# Patient Record
Sex: Female | Born: 1940 | Race: Black or African American | Hispanic: No | Smoking: Never smoker
Health system: Southern US, Community
[De-identification: ages and names within clinical notes are randomized; demographics above are authoritative.]

## PROBLEM LIST (undated history)

## (undated) DIAGNOSIS — I10 Essential (primary) hypertension: Secondary | ICD-10-CM

## (undated) DIAGNOSIS — E119 Type 2 diabetes mellitus without complications: Secondary | ICD-10-CM

## (undated) DIAGNOSIS — Z5189 Encounter for other specified aftercare: Secondary | ICD-10-CM

## (undated) HISTORY — PX: BREAST SURGERY: SHX581

## (undated) HISTORY — PX: BACK SURGERY: SHX140

## (undated) HISTORY — PX: WRIST SURGERY: SHX841

## (undated) HISTORY — PX: BREAST EXCISIONAL BIOPSY: SUR124

## (undated) HISTORY — PX: NEPHRECTOMY: SHX65

## (undated) HISTORY — PX: ABDOMINAL HYSTERECTOMY: SHX81

---

## 1964-07-29 HISTORY — PX: ABDOMINAL HYSTERECTOMY: SHX81

## 1974-07-29 HISTORY — PX: BREAST SURGERY: SHX581

## 1976-07-29 HISTORY — PX: BACK SURGERY: SHX140

## 1980-07-29 HISTORY — PX: NEPHRECTOMY: SHX65

## 2005-10-04 ENCOUNTER — Encounter (INDEPENDENT_AMBULATORY_CARE_PROVIDER_SITE_OTHER): Payer: Self-pay | Admitting: *Deleted

## 2005-10-04 ENCOUNTER — Ambulatory Visit (HOSPITAL_COMMUNITY): Admission: RE | Admit: 2005-10-04 | Discharge: 2005-10-04 | Payer: Self-pay | Admitting: Gastroenterology

## 2007-03-12 ENCOUNTER — Encounter: Admission: RE | Admit: 2007-03-12 | Discharge: 2007-03-12 | Payer: Self-pay | Admitting: Internal Medicine

## 2007-07-30 HISTORY — PX: WRIST SURGERY: SHX841

## 2007-10-22 ENCOUNTER — Inpatient Hospital Stay (HOSPITAL_COMMUNITY): Admission: EM | Admit: 2007-10-22 | Discharge: 2007-10-25 | Payer: Self-pay | Admitting: Emergency Medicine

## 2007-10-23 ENCOUNTER — Encounter (INDEPENDENT_AMBULATORY_CARE_PROVIDER_SITE_OTHER): Payer: Self-pay | Admitting: Gastroenterology

## 2008-02-19 ENCOUNTER — Emergency Department (HOSPITAL_COMMUNITY): Admission: EM | Admit: 2008-02-19 | Discharge: 2008-02-19 | Payer: Self-pay | Admitting: Emergency Medicine

## 2009-01-18 ENCOUNTER — Ambulatory Visit (HOSPITAL_BASED_OUTPATIENT_CLINIC_OR_DEPARTMENT_OTHER): Admission: RE | Admit: 2009-01-18 | Discharge: 2009-01-18 | Payer: Self-pay | Admitting: *Deleted

## 2009-01-18 ENCOUNTER — Encounter (INDEPENDENT_AMBULATORY_CARE_PROVIDER_SITE_OTHER): Payer: Self-pay | Admitting: *Deleted

## 2009-02-10 ENCOUNTER — Encounter: Admission: RE | Admit: 2009-02-10 | Discharge: 2009-02-10 | Payer: Self-pay | Admitting: Internal Medicine

## 2009-04-07 ENCOUNTER — Ambulatory Visit (HOSPITAL_COMMUNITY): Admission: RE | Admit: 2009-04-07 | Discharge: 2009-04-07 | Payer: Self-pay | Admitting: Nephrology

## 2009-05-05 ENCOUNTER — Encounter: Admission: RE | Admit: 2009-05-05 | Discharge: 2009-05-05 | Payer: Self-pay | Admitting: Internal Medicine

## 2009-07-04 ENCOUNTER — Encounter: Admission: RE | Admit: 2009-07-04 | Discharge: 2009-07-04 | Payer: Self-pay | Admitting: Surgery

## 2009-07-11 ENCOUNTER — Encounter (HOSPITAL_COMMUNITY): Admission: RE | Admit: 2009-07-11 | Discharge: 2009-07-28 | Payer: Self-pay | Admitting: Nephrology

## 2009-07-28 IMAGING — CT CT PELVIS W/ CM
2 of 5 series · 17 of 46 positions shown, 19 images · IV contrast (READICAT/WATER & [ID] OMNI 300)
Comparison: none

CLINICAL DATA: Abdominal pain, particularly right lower quadrant.
ABDOMEN CT WITH CONTRAST:
TECHNIQUE: Multidetector CT imaging of the abdomen was performed following the standard protocol during bolus administration of intravenous contrast.
Contrast:  100 cc of Omnipaque 300.
There is a   6 mm noncalcified nodule within the right lower lobe on image #4.  CT chest may be helpful to assess further.  The liver is somewhat low in attenuation suggesting fatty infiltration.  No focal abnormality is seen.  There are calcified gallstones layering within the gallbladder. The pancreas is normal in size and the pancreatic duct is minimally prominent.  The adrenal glands and spleen appear normal. The left kidney is prominent, probably hypertrophied with absent right kidney.  Has the patient had prior right nephrectomy?  Abdominal aorta is normal in caliber.  No adenopathy is seen.  A few small nodes are present within the right lower quadrant.
TECHNIQUE: Multidetector CT imaging of the pelvis was performed following the standard protocol during bolus administration of intravenous contrast.
The appendix fills well with air and contrast and there is no evidence of acute appendicitis.  The urinary bladder is not well distended but is grossly unremarkable.  The patient has previously undergone hysterectomy and no adnexal lesion is seen.  No fluid is noted within the pelvis.  The terminal ileum appears grossly normal.  The colon is decompressed.  No inflammatory process is seen.

[Series 3: routine abdomen · axial · 0.70mm/px · z∈[-364,+21]mm · 14 of 86 slices shown, 16 images]
[im 5/86  soft-tissue]
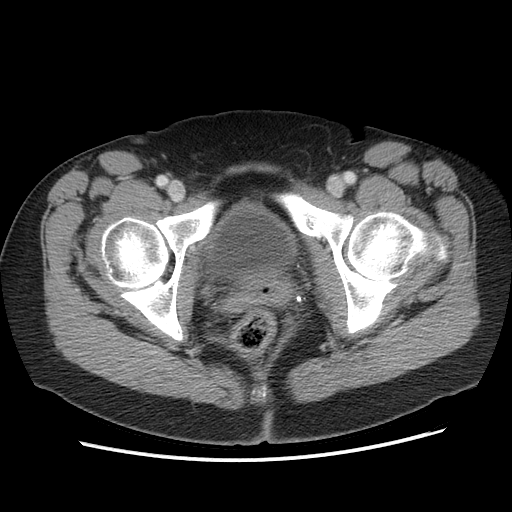
[im 5/86  bone]
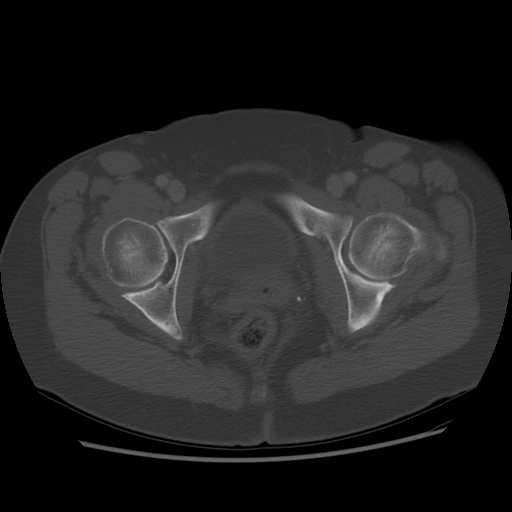
[im 10/86  soft-tissue]
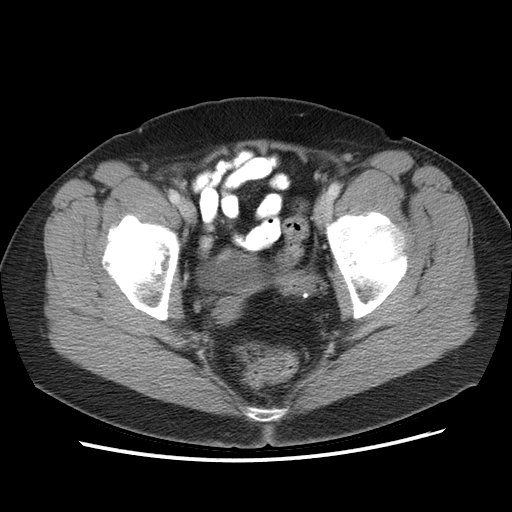
[im 19/86  soft-tissue]
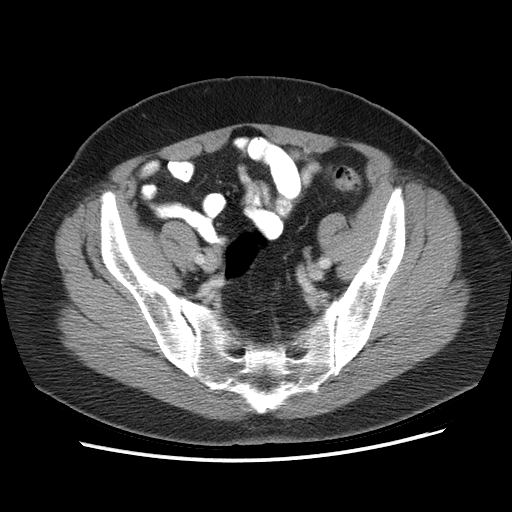
[im 24/86  soft-tissue]
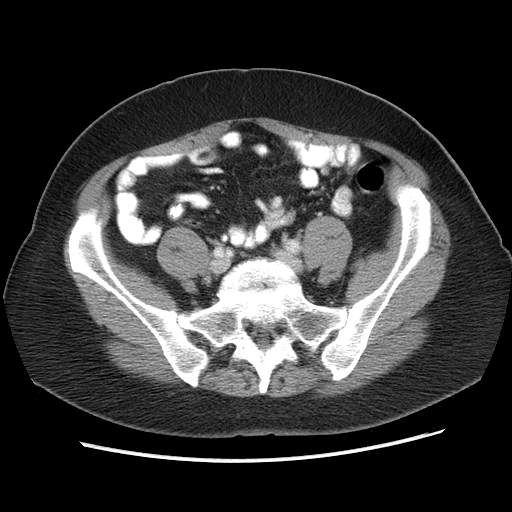
[im 29/86  soft-tissue]
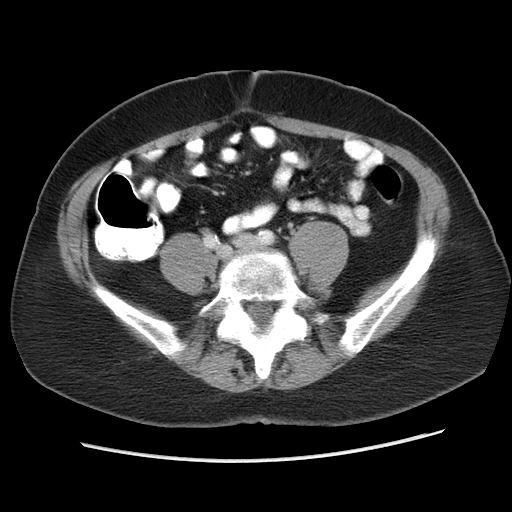
[im 34/86  soft-tissue]
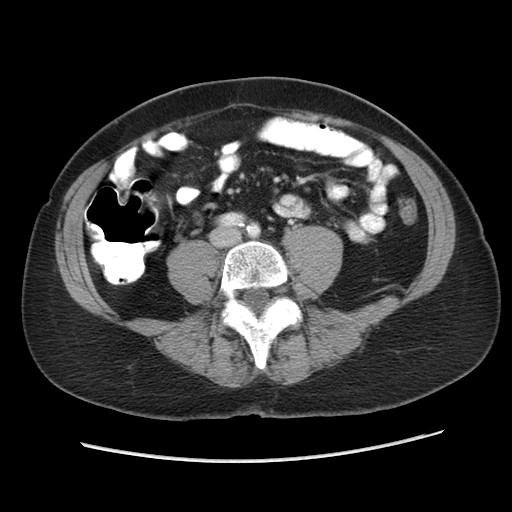
[im 38/86  soft-tissue]
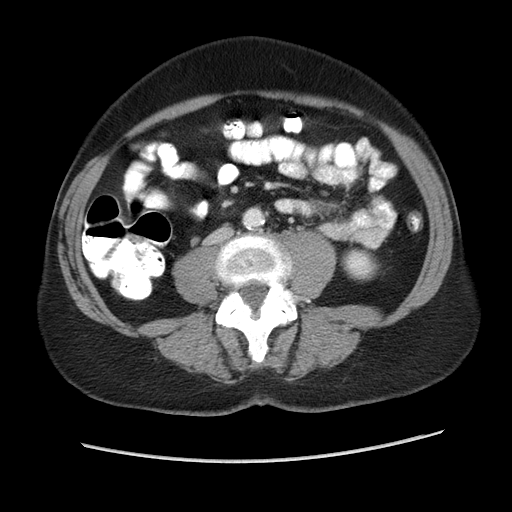
[im 48/86  soft-tissue]
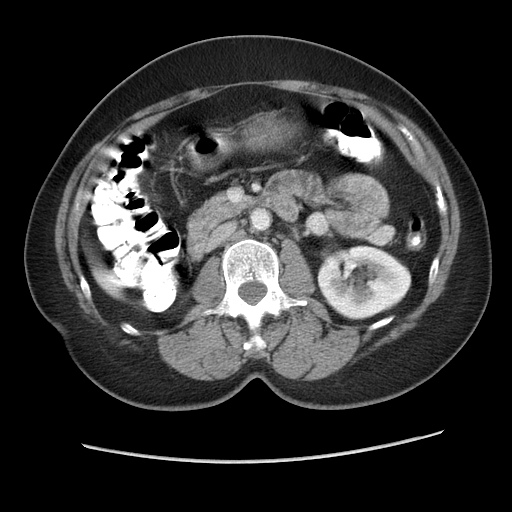
[im 52/86  soft-tissue]
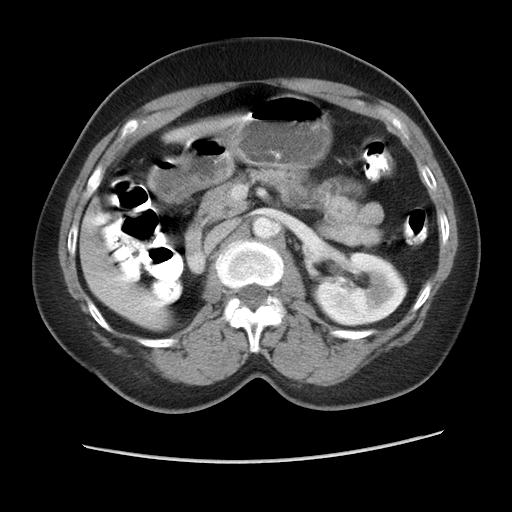
[im 52/86  bone]
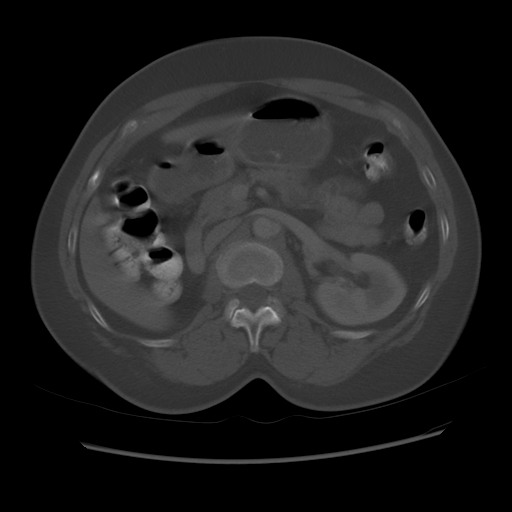
[im 57/86  soft-tissue]
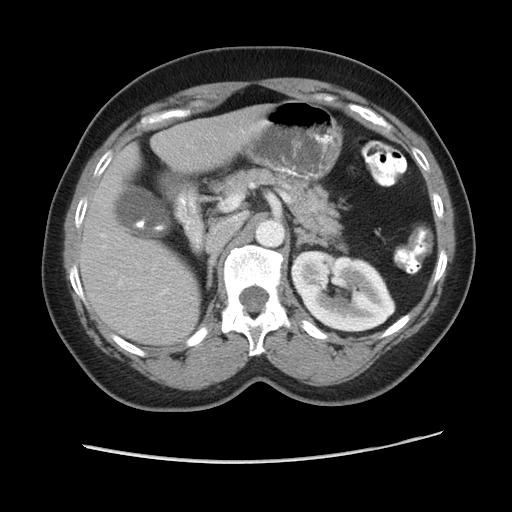
[im 62/86  soft-tissue]
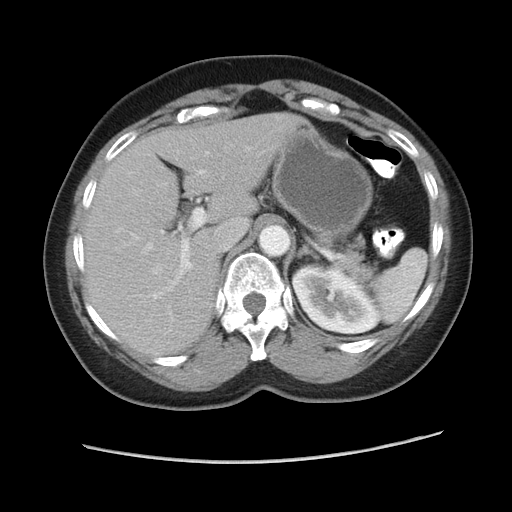
[im 67/86  soft-tissue]
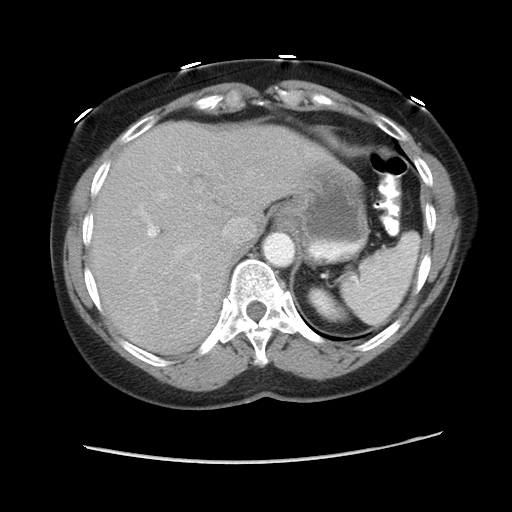
[im 76/86  soft-tissue]
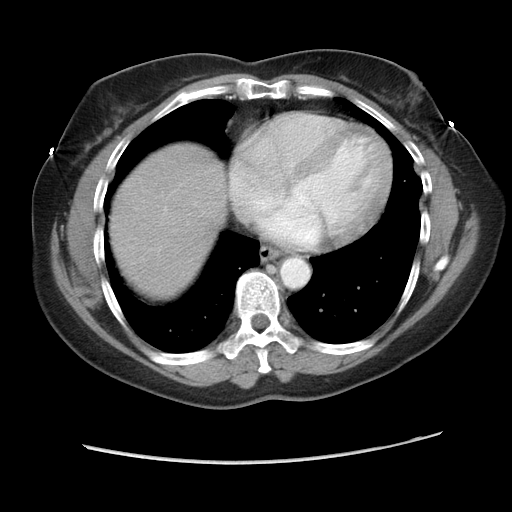
[im 81/86  soft-tissue]
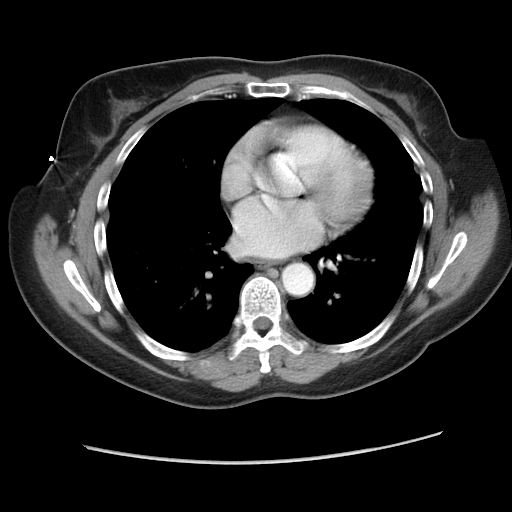

[Series 602: sagittal body · sagittal · 0.99mm/px · 3 of 145 slices shown]
[im 49/145  soft-tissue]
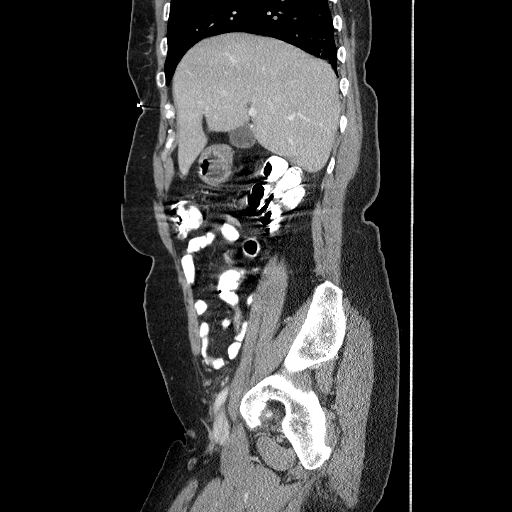
[im 65/145  soft-tissue]
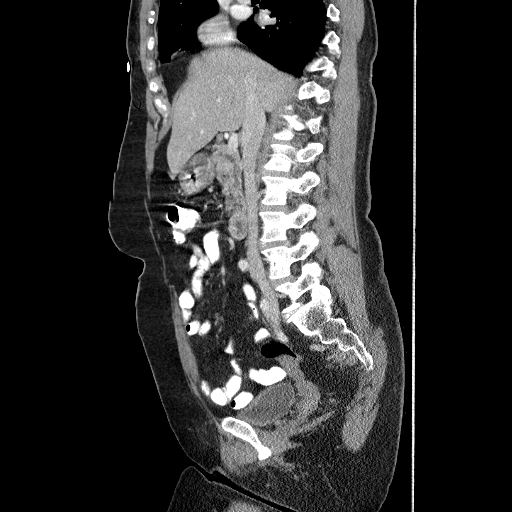
[im 81/145  soft-tissue]
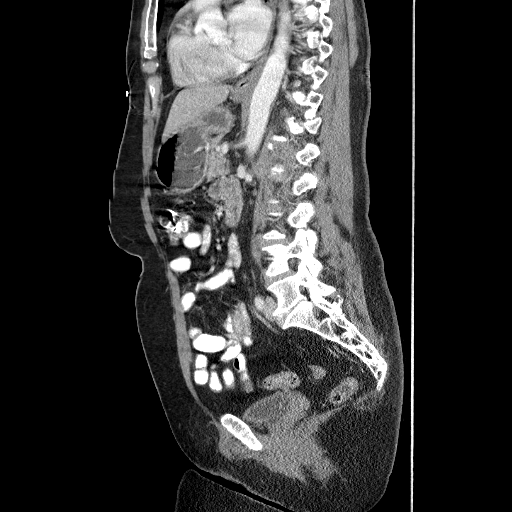

[17 of 46 positions shown; findings below may reference images not displayed]

IMPRESSION: 1.  Gallstones.  No ductal dilatation.
2.  6 mm noncalcified nodule in the right lower lobe.  Consider CT of the chest to assess further.
3.  Compensatorily hypertrophied left kidney with absent right kidney.  Question prior right nephrectomy.
PELVIS CT WITH CONTRAST:
IMPRESSION: No acute abnormality on CT pelvis.   The appendix is well seen and appears normal.

## 2009-08-10 ENCOUNTER — Encounter (HOSPITAL_COMMUNITY): Admission: RE | Admit: 2009-08-10 | Discharge: 2009-10-04 | Payer: Self-pay | Admitting: Nephrology

## 2010-03-09 ENCOUNTER — Encounter: Admission: RE | Admit: 2010-03-09 | Discharge: 2010-03-09 | Payer: Self-pay | Admitting: Internal Medicine

## 2010-08-19 ENCOUNTER — Encounter: Payer: Self-pay | Admitting: Internal Medicine

## 2010-08-20 ENCOUNTER — Encounter: Payer: Self-pay | Admitting: Internal Medicine

## 2010-11-05 LAB — POCT I-STAT, CHEM 8
BUN: 17 mg/dL (ref 6–23)
Calcium, Ion: 1.29 mmol/L (ref 1.12–1.32)
Chloride: 109 mEq/L (ref 96–112)
Creatinine, Ser: 1.3 mg/dL — ABNORMAL HIGH (ref 0.4–1.2)
Glucose, Bld: 92 mg/dL (ref 70–99)
HCT: 37 % (ref 36.0–46.0)
Hemoglobin: 12.6 g/dL (ref 12.0–15.0)
Potassium: 3.9 mEq/L (ref 3.5–5.1)
Sodium: 137 mEq/L (ref 135–145)
TCO2: 24 mmol/L (ref 0–100)

## 2010-11-05 LAB — BASIC METABOLIC PANEL
BUN: 16 mg/dL (ref 6–23)
CO2: 26 mEq/L (ref 19–32)
Calcium: 12 mg/dL — ABNORMAL HIGH (ref 8.4–10.5)
Chloride: 105 mEq/L (ref 96–112)
Creatinine, Ser: 1.27 mg/dL — ABNORMAL HIGH (ref 0.4–1.2)
GFR calc Af Amer: 50 mL/min — ABNORMAL LOW (ref >60)
GFR calc non Af Amer: 42 mL/min — ABNORMAL LOW (ref >60)
Glucose, Bld: 93 mg/dL (ref 70–99)
Potassium: 5.7 mEq/L — ABNORMAL HIGH (ref 3.5–5.1)
Sodium: 136 mEq/L (ref 135–145)

## 2010-11-05 LAB — GLUCOSE, CAPILLARY: Glucose-Capillary: 77 mg/dL (ref 70–99)

## 2010-12-11 NOTE — Consult Note (Signed)
[name], [name] NO.:  [id number]   MEDICAL RECORD NO.:  [id number]          PATIENT TYPE:  INP   LOCATION:  1421                         FACILITY:  [facility]   PHYSICIAN:  Jordan Hawks. Elnoria Howard, MD    DATE OF BIRTH:  1941-07-16   DATE OF CONSULTATION:  10/23/2007  DATE OF DISCHARGE:                                 CONSULTATION   REASON FOR CONSULTATION:  Melena and weakness.   HISTORY OF PRESENT ILLNESS:  This is a 70 year old female who is well-  known to me from a similar presentation in the office in the past, who  is admitted to the hospital with complaints of melena and weakness.  The  patient states that her symptoms started acutely on the day of  admission.  Previous to this time she was in her usual state of health  and denied having any abdominal complaints.  In September 2008 she was  evaluated in the office and at that time she had complained of some  abdominal pain but this was promptly resolved with the use of  omeprazole.  Subsequently, she has been well to my knowledge from the GI  standpoint since that time. Upon presentation to the hospital she was  noted to have a drop in her hemoglobin at approximately 8.9.  She denies  any chest pain or shortness of breath.  Vital signs appear to be stable.  In March 2007 the patient had undergone an EGD and colonoscopy and  during that evaluation she was noted to have a 1 cm antral ulcer which  was non-bleeding.   PAST MEDICAL AND SURGICAL HISTORY:  Significant for gastric ulcer, acute  renal failure, diabetes, hyperlipidemia, and hypertension.   FAMILY HISTORY:  Noncontributory.   SOCIAL HISTORY:  Negative for alcohol, tobacco, or illicit drug use.   ALLERGIES:  To PENICILLIN.   MEDICATIONS:  1. Diphenhydramine.  2. Insulin.  3. Protonix.  4. Tylenol.  5. Morphine sulfate.  6. Phenergan.   REVIEW OF SYSTEMS:  As stated above in history present illness otherwise  negative.   PHYSICAL EXAMINATION:   VITAL SIGNS:  Blood pressure is 139/74, heart  rate is 79, respirations 18, temperature is 98.2.  GENERAL:  The patient is in no acute distress, alert and oriented.  HEENT:  Normocephalic, atraumatic.  Extraocular muscles intact.  Neck is supple.  No lymphadenopathy.  Lungs are clear to auscultation bilaterally.  CARDIOVASCULAR:  Regular rate and rhythm.  Abdomen is flat, soft, nontender, nondistended.  Positive bowel sounds.  EXTREMITIES:  No clubbing, cyanosis or edema.   LABORATORY VALUES:  White blood cell count 8.6, hemoglobin 8.0.  Sodium  is 140, potassium 4.6, chloride 111, CO2 25, glucose 110, BUN 30,  creatinine 1.0, total bilirubin is 0.60, alk phos 77, AST 14, ALT 16,  albumin 3.0.   IMPRESSION:  1. Melena.  2. History of gastric ulcer.  The patient did undergo an EGD at the      time of this consultation and she was noted to have to antral      ulcers which were clean base  and no evidence of any active      bleeding, and there is persistent duodenitis prior EGD with      biopsies negative for any evidence of any Helicobacter pylori.      With the persistence of her current symptoms I feel that she may      benefit from H. pylori treatment even if the repeat biopsies are      negative.  However, this can be started as an outpatient.   PLAN:  Agree with continued transfusion, addition of sucralfate 2 grams  p.o. b.i.d.  The patient will follow up in the office this coming week  and most likely to be started on H. pylori therapy.      Jordan Hawks Elnoria Howard, MD  Electronically Signed     PDH/MEDQ  D:  10/23/2007  T:  10/24/2007  Job:  [id number]

## 2010-12-11 NOTE — Op Note (Signed)
Jean [name], Jean [name]               ACCOUNT NO.:  [id number]   MEDICAL RECORD NO.:  [id number]          PATIENT TYPE:  AMB   LOCATION:  DSC                          FACILITY:  [facility]   PHYSICIAN:  Tennis Must Meyerdierks, M.D.DATE OF BIRTH:  1940/12/11   DATE OF PROCEDURE:  01/18/2009  DATE OF DISCHARGE:                               OPERATIVE REPORT   PREOPERATIVE DIAGNOSIS:  Volar ganglion, right wrist.   POSTOPERATIVE DIAGNOSIS:  Volar ganglion, right wrist.   PROCEDURE:  Excision of volar ganglion, right wrist.   SURGEON:  Lowell Bouton, MD   ANESTHESIA:  General.   OPERATIVE FINDINGS:  The patient had a large cyst that appeared to arise  from the radioscaphoid joint.  It was densely adherent to the radial  artery.   PROCEDURE:  Under general anesthesia with a tourniquet on the right arm,  the right hand was prepped and draped in the usual fashion.  After  exsanguinating the limb, the tourniquet was inflated to 250 mmHg.  A V-  shaped incision was made over the volar aspect of the scaphoid  tuberosity and carried down through the subcutaneous tissues.  Blunt  dissection was carried down to the mass and the radial artery was  dissected out proximally.  A vessel loop was placed around the radial  artery and blunt dissection was used to dissect the artery off of the  cyst.  The cyst was then excised sharply and traced down to the carpus.  Rongeur was used to debride the capsular tissue in the wrist joint.  The  Therapist, nutritional was used to free up the area in the radioscaphoid joint.  The wounds were then copiously irrigated.  The 0.5% Marcaine was placed  in the skin edges for pain control.  The capsular tissue was closed with  4-0 Vicryl.  A vessel loop drain was left in for drainage.  The  tourniquet was released with no bleeding from the artery.  The skin was  closed with 3-0 subcuticular Prolene.  Steri-Strips were applied  followed by sterile dressings and a  volar wrist splint.  The patient  tolerated the procedure well and went to the recovery room awake and  stable in good condition.      Lowell Bouton, M.D.  Electronically Signed     EMM/MEDQ  D:  01/18/2009  T:  01/19/2009  Job:  [id number]   cc:   Candyce Churn. Allyne Gee, M.D.

## 2010-12-11 NOTE — Discharge Summary (Signed)
NAMEALAURA, Jean [name] NO.:  [id number]   MEDICAL RECORD NO.:  [id number]          PATIENT TYPE:  INP   LOCATION:  1421                         FACILITY:  [facility]   PHYSICIAN:  Herbie Saxon, MDDATE OF BIRTH:  [birth year]   DATE OF ADMISSION:  10/22/2007  DATE OF DISCHARGE:  10/25/2007                               DISCHARGE SUMMARY   _________   DISCHARGE DIAGNOSES:  1. Gastrointestinal bleed.  2. gastric ulcer/diverticulosis/hemorrhoids  3. Anemia.  4.Borderline diabetes mellitus  PROCEDURE  EGD showed gastric ulcers,colonoscopy-diverticula,hemorrhoids   RADIOLOGY:  The abd USS of  October 22, 2007 showed cholelithiasis.  The patient  is status post right nephrectomy.   HOSPITAL COURSE:  This 70 year old African American lady presented to  the emergency department  with  lower abdominal pain.  She did have  dehydration at inception with elevated BUN and creatinine of 1.1. The  patient was started on IV fluids  and transfused with blood. A repeat  hematocrit on October 23, 2007, showed improvement after the patient  received 2 units of packed red blood cells .Lipid panel_ showed that she  had hypertriglyceridemia . Hematocrit stayed stable.  The patient was  initially having borderline hyperglycemia.  This admission she was  started on a diabetic diet and is to be reviewed by her primary care  physician to follow up diabetic care.   MEDICATIONS:  1. Prilosec 20 mg b.i.d.  2. Carafate 2 gram b.i.d.  3. Ferrous sulfate325  mg b.i.d.  4. Multivitamin tablet p.o. daily.   EXAMINATION  HEENT:PERLA  Neck: supple  Chest: clear  CVS:S1,S2  ZOX:WRUEAV  WUJ:WJXBJYN normal  EXT:No pedal edema  diet-regular   follow up with PCP in 3-5days  follow up Dr Hung[GI] 1 week      Herbie Saxon, MD  Electronically Signed     MIO/MEDQ  D:  10/25/2007  T:  10/25/2007  Job:  [id number]

## 2010-12-11 NOTE — H&P (Signed)
Jean [name], Jean [name] NO.:  [id number]   MEDICAL RECORD NO.:  [id number]          PATIENT TYPE:  INP   LOCATION:  1421                         [facility]ACILITY:  [facility]   PHYSICIAN:  Herbie Saxon, MDDATE OF BIRTH:  June 29, 1941   DATE OF ADMISSION:  10/22/2007  DATE OF DISCHARGE:                              HISTORY & PHYSICAL   PRIMARY CARE Amoree Newlon:  Rickard Patience, PA   HEALTH CARE POWER OF ATTORNEY:  Her husband, Younique Casad, phone #[phone].   She is a full code.   GASTROENTEROLOGIST:  Jordan Hawks. Elnoria Howard, MD   PRESENTING COMPLAINT:  Dizziness, dark stools, 1 day duration.   HISTORY OF PRESENTING COMPLAINT:  This is a 70 year old African American  female who was quite well until this afternoon when she started feeling  extremely weak and dizzy.  Subsequently had an episode of profuse, dark  blood in the stool.  She denies any hematemesis or hematochezia.  Denies  any nausea or vomiting.  There is no diarrhea or constipation.  She has  bilateral lower quadrant abdominal pain.  No jaundice.  The patient had  colonoscopy 2 years earlier in 2007 by Dr. Elnoria Howard at which time colon  polyps were removed and biopsied.  No reports of tumor at that time.  The patient does not give any history of unintentional weight loss.  Other systems reviewed were negative.   PAST MEDICAL HISTORY:  Hypertension, hypercholesterolemia,  gastroesophageal reflux disease, colon polyps.   SOCIAL HISTORY:  The patient lives with her husband. Has 8 children.  No  history of drug abuse, alcohol or tobacco abuse.   FAMILY HISTORY:  A sister has angiosarcoma.  Sister has breast cancer.  Another sister has dementia.[name]   PAST SURGERY:  Right kidney removal.  Plus has a past history of kidney  stones.  The patient also had a back surgery.   MEDICATIONS:  1. Zetia.  2. Blood pressure medication, name not known.   ALLERGIES:  SHE IS ALLERGIC TO PENICILLIN.   PHYSICAL EXAMINATION:   On examination she is an elderly lady in no acute  respiratory distress.  Temperature is 98.  Pulse is 130.  Respiratory  rate 20.  Blood pressure 137/76.  She is pale, not jaundiced.  There is  no cyanosis or clubbing.  She is dehydrated.  Pupils are equal, reactive  to light and accommodation.  Extraocular muscles are intact.  Oropharynx  and nasopharynx are clear.  NECK:  Supple.  No elevated JVD or thyromegaly, or carotid bruit.  HEART:  Sounds 1 and 2, tachycardic.  ABDOMEN:  Soft.  Mild lower quadrant tenderness.  No organomegaly  palpable.  She is alert and oriented to time, place, and person.  Power is 5  globally.  Deep tendon reflexes 2+ globally.  Peripheral pulses present.  No pedal edema.  No skin, joint, or skin rash.   LABORATORY DATA:  Available data, WBC 9.6, hematocrit 28.6, platelet  count 250.  INR is 1.0.  CMP shows a sodium of 138, potassium 4.9,  chloride 110, bicarbonate 23, glucose 164.  BUN 51, creatinine 1.1.  AST  is 99.  ART is 20.   ASSESSMENT:  1. Lower gastrointestinal bleed.  2. Anemia.  3. Acute renal failure.  4. Rule out new onset diabetes.  5. History of colon polyps.  6. History of hypertension.  7. Hypercholesterolemia.  8. Gastroesophageal reflux disease.   The patient is to be admitted to telemetry bed.  Get consult with Dr.  Elnoria Howard.  Clear liquids until Dr. Elnoria Howard evaluates.  He is to be n.p.o. after  midnight for endoscopy in the a.m.  Order p.o. medications for now.  Type and cross-match 4 units of packed red blood cells, and transfuse  with 2 units packed red blood cell with 40 mg IV Lasix, if hematocrit  drops below 25.  H and H q.8 h.  Chest x-ray and EKG.  SCDs  for DVT  prophylaxis and put on 40 mg IV q.12 h.  The patient's medications and  test explained to her and her family.  They verbalize understanding.      Herbie Saxon, MD  Electronically Signed     MIO/MEDQ  D:  10/22/2007  T:  10/22/2007  Job:  [id number]

## 2011-04-22 LAB — CROSSMATCH
ABO/RH(D): B POS
Antibody Screen: NEGATIVE

## 2011-04-22 LAB — BASIC METABOLIC PANEL
BUN: 14
BUN: 30 — ABNORMAL HIGH
CO2: 25
CO2: 27
Calcium: 10.1
Calcium: 9.8
Chloride: 107
Chloride: 111
Creatinine, Ser: 0.94
Creatinine, Ser: 1
GFR calc Af Amer: >60
GFR calc Af Amer: >60
GFR calc non Af Amer: 55 — ABNORMAL LOW
GFR calc non Af Amer: 59 — ABNORMAL LOW
Glucose, Bld: 110 — ABNORMAL HIGH
Glucose, Bld: 120 — ABNORMAL HIGH
Potassium: 4.5
Potassium: 4.6
Sodium: 139
Sodium: 140

## 2011-04-22 LAB — HEMOGLOBIN A1C
Hgb A1c MFr Bld: 7.1 — ABNORMAL HIGH
Mean Plasma Glucose: 175

## 2011-04-22 LAB — URINALYSIS, MICROSCOPIC ONLY
Bilirubin Urine: NEGATIVE
Glucose, UA: NEGATIVE
Hgb urine dipstick: NEGATIVE
Ketones, ur: NEGATIVE
Leukocytes, UA: NEGATIVE
Nitrite: NEGATIVE
Protein, ur: NEGATIVE
Specific Gravity, Urine: 1.01
Urobilinogen, UA: 0.2
pH: 6

## 2011-04-22 LAB — CBC
HCT: 23.3 — ABNORMAL LOW
HCT: 28.6 — ABNORMAL LOW
HCT: 33.4 — ABNORMAL LOW
Hemoglobin: 11.4 — ABNORMAL LOW
Hemoglobin: 8 — ABNORMAL LOW
Hemoglobin: 9.5 — ABNORMAL LOW
MCHC: 33.2
MCHC: 34
MCHC: 34.1
MCV: 84.8
MCV: 84.9
MCV: 85.1
Platelets: 204
Platelets: 207
Platelets: 250
RBC: 2.74 — ABNORMAL LOW
RBC: 3.36 — ABNORMAL LOW
RBC: 3.94
RDW: 15
RDW: 15.1
RDW: 15.9 — ABNORMAL HIGH
WBC: 8.6
WBC: 9
WBC: 9.6

## 2011-04-22 LAB — HEPATIC FUNCTION PANEL
ALT: 16
AST: 14
Albumin: 3 — ABNORMAL LOW
Alkaline Phosphatase: 77
Bilirubin, Direct: 0.1
Indirect Bilirubin: 0.5
Total Bilirubin: 0.6
Total Protein: 5.5 — ABNORMAL LOW

## 2011-04-22 LAB — DIFFERENTIAL
Basophils Absolute: 0.1
Basophils Relative: 1
Eosinophils Absolute: 0
Eosinophils Relative: 0
Lymphocytes Relative: 13
Lymphs Abs: 1.2
Monocytes Absolute: 0.4
Monocytes Relative: 4
Neutro Abs: 7.9 — ABNORMAL HIGH
Neutrophils Relative %: 82 — ABNORMAL HIGH

## 2011-04-22 LAB — COMPREHENSIVE METABOLIC PANEL
ALT: 20
AST: 19
Albumin: 3.7
Alkaline Phosphatase: 89
BUN: 51 — ABNORMAL HIGH
CO2: 23
Calcium: 10.4
Chloride: 110
Creatinine, Ser: 1.12
GFR calc Af Amer: 59 — ABNORMAL LOW
GFR calc non Af Amer: 49 — ABNORMAL LOW
Glucose, Bld: 164 — ABNORMAL HIGH
Potassium: 4.9
Sodium: 138
Total Bilirubin: 0.7
Total Protein: 6.7

## 2011-04-22 LAB — LIPID PANEL
Cholesterol: 165
HDL: 31 — ABNORMAL LOW
LDL Cholesterol: 99
Total CHOL/HDL Ratio: 5.3
Triglycerides: 177 — ABNORMAL HIGH
VLDL: 35

## 2011-04-22 LAB — TSH: TSH: 2.96

## 2011-04-22 LAB — HEMOGLOBIN AND HEMATOCRIT, BLOOD
HCT: 26.5 — ABNORMAL LOW
HCT: 31.9 — ABNORMAL LOW
Hemoglobin: 11 — ABNORMAL LOW
Hemoglobin: 8.9 — ABNORMAL LOW

## 2011-04-22 LAB — PROTIME-INR
INR: 1
Prothrombin Time: 13.3

## 2011-04-22 LAB — HEMATOCRIT: HCT: 31.8 — ABNORMAL LOW

## 2011-04-22 LAB — ABO/RH: ABO/RH(D): B POS

## 2011-04-22 LAB — PREPARE RBC (CROSSMATCH)

## 2011-04-22 LAB — APTT: aPTT: 22 — ABNORMAL LOW

## 2011-04-22 LAB — HOMOCYSTEINE: Homocysteine: 12.3

## 2012-09-21 ENCOUNTER — Emergency Department (HOSPITAL_COMMUNITY)
Admission: EM | Admit: 2012-09-21 | Discharge: 2012-09-23 | Disposition: A | Discharge status: Home / Self Care | Payer: Medicare Other | Attending: Emergency Medicine | Admitting: Emergency Medicine

## 2012-09-21 ENCOUNTER — Encounter (HOSPITAL_COMMUNITY): Payer: Self-pay | Admitting: Emergency Medicine

## 2012-09-21 DIAGNOSIS — R5381 Other malaise: Secondary | ICD-10-CM | POA: Insufficient documentation

## 2012-09-21 DIAGNOSIS — K921 Melena: Secondary | ICD-10-CM | POA: Insufficient documentation

## 2012-09-21 DIAGNOSIS — R42 Dizziness and giddiness: Secondary | ICD-10-CM | POA: Insufficient documentation

## 2012-09-21 DIAGNOSIS — R5383 Other fatigue: Secondary | ICD-10-CM | POA: Insufficient documentation

## 2012-09-21 HISTORY — DX: Encounter for other specified aftercare: Z51.89

## 2012-09-21 HISTORY — DX: Essential (primary) hypertension: I10

## 2012-09-21 HISTORY — DX: Type 2 diabetes mellitus without complications: E11.9

## 2012-09-21 LAB — URINALYSIS, ROUTINE W REFLEX MICROSCOPIC
Bilirubin Urine: NEGATIVE
Glucose, UA: NEGATIVE mg/dL
Hgb urine dipstick: NEGATIVE
Ketones, ur: NEGATIVE mg/dL
Nitrite: NEGATIVE
Protein, ur: NEGATIVE mg/dL
Specific Gravity, Urine: 1.016 (ref 1.005–1.030)
Urobilinogen, UA: 0.2 mg/dL (ref 0.0–1.0)
pH: 5.5 (ref 5.0–8.0)

## 2012-09-21 LAB — POCT I-STAT, CHEM 8
BUN: 15 mg/dL (ref 6–23)
Calcium, Ion: 1.28 mmol/L (ref 1.13–1.30)
Chloride: 107 mEq/L (ref 96–112)
Creatinine, Ser: 1.3 mg/dL — ABNORMAL HIGH (ref 0.50–1.10)
Glucose, Bld: 95 mg/dL (ref 70–99)
HCT: 40 % (ref 36.0–46.0)
Hemoglobin: 13.6 g/dL (ref 12.0–15.0)
Potassium: 4.2 mEq/L (ref 3.5–5.1)
Sodium: 140 mEq/L (ref 135–145)
TCO2: 26 mmol/L (ref 0–100)

## 2012-09-21 LAB — CBC
HCT: 36.9 % (ref 36.0–46.0)
Hemoglobin: 12.9 g/dL (ref 12.0–15.0)
MCH: 29.5 pg (ref 26.0–34.0)
MCHC: 35 g/dL (ref 30.0–36.0)
MCV: 84.2 fL (ref 78.0–100.0)
Platelets: 225 10*3/uL (ref 150–400)
RBC: 4.38 MIL/uL (ref 3.87–5.11)
RDW: 14.5 % (ref 11.5–15.5)
WBC: 7.5 10*3/uL (ref 4.0–10.5)

## 2012-09-21 LAB — URINE MICROSCOPIC-ADD ON

## 2012-09-21 MED ORDER — SODIUM CHLORIDE 0.9 % IV SOLN: INTRAVENOUS | Status: DC

## 2012-09-21 NOTE — ED Provider Notes (Signed)
History     CSN: [id number]  Arrival date & time 09/21/12  1500   First MD Initiated Contact with Patient 09/21/12 1512      Chief Complaint  Patient presents with  . Rectal Bleeding    (Consider location/radiation/quality/duration/timing/severity/associated sxs/prior treatment) The history is provided by the patient and medical records (Dr. Elnoria Howard). No language interpreter was used.   72 year old female presents the emergency department for chief complaint of melena.  Patient states that proximally 2 days ago she began having dark or tarry colored stools weakness, and dizziness.  Patient states she has a history of GI bleed that required admission in 2010.  She was found at that time to have a peptic ulcer.  He shouldn't states that over the past 2 days her dark tarry stools have been worsening in frequency.  Her symptoms of dizziness and weakness have also been worsening.  Patient was seen earlier this afternoon by her GI specialist Dr. Elnoria Howard.  She was found have a positive stool occult blood.  She was also tachycardic per nursing notes.  Patient was sent over for evaluation and admission by [facility].  Patient's indicate B positive blood type.  She denies any symptoms of racing or skipping heart.  She denies any episodes of syncope. No past medical history on file.  No past surgical history on file.  No family history on file.  History  Substance Use Topics  . Smoking status: Not on file  . Smokeless tobacco: Not on file  . Alcohol Use: Not on file    OB History   No data available      Review of Systems Ten systems reviewed and are negative for acute change, except as noted in the HPI.   Allergies  Review of patient's allergies indicates not on file.  Home Medications  No current outpatient prescriptions on file.  BP 140/68  Pulse 86  Temp(Src) 98.3 F (36.8 C) (Oral)  Resp 18  SpO2 99%  Physical Exam  Physical Exam  Nursing note and vitals  reviewed. Constitutional: She is oriented to person, place, and time. She appears well-developed and well-nourished. No distress.  HENT:  Head: Normocephalic and atraumatic.  Eyes: Conjunctivae normal and EOM are normal. Pupils are equal, round, and reactive to light. No scleral icterus.  Neck: Normal range of motion.  Cardiovascular: Normal rate, regular rhythm and normal heart sounds.  Exam reveals no gallop and no friction rub.   No murmur heard. Pulmonary/Chest: Effort normal and breath sounds normal. No respiratory distress.  Abdominal: Soft. Bowel sounds are normal. She exhibits no distension and no mass. There is no tenderness. There is no guarding.  Neurological: She is alert and oriented to person, place, and time.  Skin: Skin is warm and dry. She is not diaphoretic.    ED Course  Procedures (including critical care time)  Labs Reviewed - No data to display No results found.   No diagnosis found.    MDM  3:30 PM Patient here with complaint of melena.  She has a history of GI bleed.  The patient does take 1 baby aspirin daily but is on no other anticoagulants.  Currently evaluating her hemoglobin.   3:43 PM BP 140/68  Pulse 86  Temp(Src) 98.3 F (36.8 C) (Oral)  Resp 18  SpO2 99%  I spoke with Dr, Elnoria Howard who requests admission for the patient. I am currently awaiting labs.  Dr. Elnoria Howard states that the patient was heme positive in his office today.  He asks for work up and hospitalist admissio for EGD in the morning.  5:00PM  I let the patient know that her urine was still pending and apologized for her wait time.  I discusse that labs so far showed no acute abnormlity. Patient's creatinine appears to be at baseline.    6:20 PM Patient labs all normal and orthostatics negative.  I have spoken with Dr. Susie Cassette who feels the patient does not meet criteria for admission as she is stable.  I agree with Dr. Susie Cassette and I will consult Physician on call for Dr. Haywood Pao  practice.   6:30 PM I Spoke with Dr. Charna Elizabeth who agrees that the patient does not meet criteria for admission. She will contact Dr. Elnoria Howard  About rescheduling the patient for EGD.  6:40 PM I went to discuss the findings of today's work up with the patient.  The patient immediately made a comment about her wait time. I apologized to the patient and informed her of the fact that we have many sick and critically ill patient's and frequently work ups take 4-6 hours in the ED.  The patient stated that the word "emergency" should be removed from our title. I then told the patient that what she had was not an emergency, thankfully.  The patient became irate and wanted to know why she had to come to the ED.  I told the patient the that with her history and previous admission,the fact that she went to her GI doctor complaining of bloody stool,weakness, dizziness and was taking aspirin, and also was tachycardic at the physician's office, that it was the appropriate medical dicision to send her for evaluation.  The patient started yelling about how I put words in her mouth and said: "take all of this stuff off of me, I don;t want to hear anything more out of your mouth."  I informed the patient that she was safe to leave and that I would give a full explanation in her discharge paperwork, as I could not get a word in. Patient will be advised to d/c daily ASA. The patient made the therapeutic relationship extremely difficult to maintain, especially because I was unable to explain the situation.    At this time there does not appear to be any evidence of an acute emergency medical condition and the patient appears stable for discharge with appropriate outpatient follow up.Diagnosis was discussed with patient who verbalizes understanding and is agreeable to discharge. Pt case discussed with Dr. Denton Lank who agrees with my plan.     Arthor Captain, PA-C 09/24/12 2133

## 2012-09-21 NOTE — ED Notes (Signed)
Pt was sent over by GI physician, Dr. Lewie Chamber. Pt reports dark stools that started 2 days ago, had some dizziness. Pt has had a GI bleed before. B/p 144/80, Pulse between 89-90. Dr wants pt admitted for a EGD tomorrow. Denies any pain. Pt is diabetic, reports her CBG this am was 109.

## 2012-09-22 ENCOUNTER — Encounter (HOSPITAL_COMMUNITY)
Admission: EM | Disposition: A | Discharge status: Home / Self Care | Payer: Self-pay | Source: Home / Self Care | Attending: Emergency Medicine

## 2012-09-22 SURGERY — EGD (ESOPHAGOGASTRODUODENOSCOPY)
Anesthesia: Moderate Sedation

## 2012-09-25 NOTE — ED Provider Notes (Signed)
Medical screening examination/treatment/procedure(s) were conducted as a shared visit with non-physician practitioner(s) and myself.  I personally evaluated the patient during the encounter Pt with heme pos stools from gi office.  Pt alert, content, nad. No pain. abd sot nt. No faintness or dizziness. Labs. Discussed w gi and med service.   Suzi Roots, MD 09/25/12 1031

## 2013-07-06 ENCOUNTER — Other Ambulatory Visit: Payer: Self-pay

## 2013-07-06 DIAGNOSIS — Z1231 Encounter for screening mammogram for malignant neoplasm of breast: Secondary | ICD-10-CM

## 2013-08-12 ENCOUNTER — Ambulatory Visit
Admission: RE | Admit: 2013-08-12 | Discharge: 2013-08-12 | Disposition: A | Discharge status: Home / Self Care | Payer: Medicare Other | Source: Ambulatory Visit

## 2013-08-12 DIAGNOSIS — Z1231 Encounter for screening mammogram for malignant neoplasm of breast: Secondary | ICD-10-CM

## 2014-10-26 ENCOUNTER — Other Ambulatory Visit: Payer: Self-pay

## 2014-10-26 DIAGNOSIS — Z1231 Encounter for screening mammogram for malignant neoplasm of breast: Secondary | ICD-10-CM

## 2014-11-04 ENCOUNTER — Ambulatory Visit
Admission: RE | Admit: 2014-11-04 | Discharge: 2014-11-04 | Disposition: A | Discharge status: Home / Self Care | Payer: Medicare Other | Source: Ambulatory Visit

## 2014-11-04 DIAGNOSIS — Z1231 Encounter for screening mammogram for malignant neoplasm of breast: Secondary | ICD-10-CM

## 2015-03-30 DIAGNOSIS — N183 Chronic kidney disease, stage 3 (moderate): Secondary | ICD-10-CM | POA: Diagnosis not present

## 2015-03-30 DIAGNOSIS — E213 Hyperparathyroidism, unspecified: Secondary | ICD-10-CM | POA: Diagnosis not present

## 2015-03-30 DIAGNOSIS — D631 Anemia in chronic kidney disease: Secondary | ICD-10-CM | POA: Diagnosis not present

## 2015-03-30 DIAGNOSIS — I10 Essential (primary) hypertension: Secondary | ICD-10-CM | POA: Diagnosis not present

## 2015-06-30 DIAGNOSIS — E119 Type 2 diabetes mellitus without complications: Secondary | ICD-10-CM | POA: Diagnosis not present

## 2015-07-11 DIAGNOSIS — M25552 Pain in left hip: Secondary | ICD-10-CM | POA: Diagnosis not present

## 2015-07-11 DIAGNOSIS — M1712 Unilateral primary osteoarthritis, left knee: Secondary | ICD-10-CM | POA: Diagnosis not present

## 2015-07-11 DIAGNOSIS — M25562 Pain in left knee: Secondary | ICD-10-CM | POA: Diagnosis not present

## 2015-07-11 DIAGNOSIS — E1122 Type 2 diabetes mellitus with diabetic chronic kidney disease: Secondary | ICD-10-CM | POA: Diagnosis not present

## 2015-07-11 DIAGNOSIS — E559 Vitamin D deficiency, unspecified: Secondary | ICD-10-CM | POA: Diagnosis not present

## 2015-07-11 DIAGNOSIS — M1612 Unilateral primary osteoarthritis, left hip: Secondary | ICD-10-CM | POA: Diagnosis not present

## 2015-07-11 DIAGNOSIS — N182 Chronic kidney disease, stage 2 (mild): Secondary | ICD-10-CM | POA: Diagnosis not present

## 2015-07-11 DIAGNOSIS — Z0000 Encounter for general adult medical examination without abnormal findings: Secondary | ICD-10-CM | POA: Diagnosis not present

## 2015-07-11 DIAGNOSIS — M79605 Pain in left leg: Secondary | ICD-10-CM | POA: Diagnosis not present

## 2015-07-11 DIAGNOSIS — H6123 Impacted cerumen, bilateral: Secondary | ICD-10-CM | POA: Diagnosis not present

## 2015-07-11 DIAGNOSIS — I129 Hypertensive chronic kidney disease with stage 1 through stage 4 chronic kidney disease, or unspecified chronic kidney disease: Secondary | ICD-10-CM | POA: Diagnosis not present

## 2015-07-18 DIAGNOSIS — M25562 Pain in left knee: Secondary | ICD-10-CM | POA: Diagnosis not present

## 2015-07-18 DIAGNOSIS — M25552 Pain in left hip: Secondary | ICD-10-CM | POA: Diagnosis not present

## 2015-08-23 DIAGNOSIS — Z23 Encounter for immunization: Secondary | ICD-10-CM | POA: Diagnosis not present

## 2015-09-25 DIAGNOSIS — E213 Hyperparathyroidism, unspecified: Secondary | ICD-10-CM | POA: Diagnosis not present

## 2015-09-25 DIAGNOSIS — N189 Chronic kidney disease, unspecified: Secondary | ICD-10-CM | POA: Diagnosis not present

## 2015-09-25 DIAGNOSIS — N183 Chronic kidney disease, stage 3 (moderate): Secondary | ICD-10-CM | POA: Diagnosis not present

## 2015-10-02 DIAGNOSIS — N183 Chronic kidney disease, stage 3 (moderate): Secondary | ICD-10-CM | POA: Diagnosis not present

## 2015-10-02 DIAGNOSIS — I10 Essential (primary) hypertension: Secondary | ICD-10-CM | POA: Diagnosis not present

## 2015-10-02 DIAGNOSIS — E213 Hyperparathyroidism, unspecified: Secondary | ICD-10-CM | POA: Diagnosis not present

## 2015-10-12 DIAGNOSIS — I129 Hypertensive chronic kidney disease with stage 1 through stage 4 chronic kidney disease, or unspecified chronic kidney disease: Secondary | ICD-10-CM | POA: Diagnosis not present

## 2015-10-12 DIAGNOSIS — N08 Glomerular disorders in diseases classified elsewhere: Secondary | ICD-10-CM | POA: Diagnosis not present

## 2015-10-12 DIAGNOSIS — E1122 Type 2 diabetes mellitus with diabetic chronic kidney disease: Secondary | ICD-10-CM | POA: Diagnosis not present

## 2015-10-12 DIAGNOSIS — Z79899 Other long term (current) drug therapy: Secondary | ICD-10-CM | POA: Diagnosis not present

## 2015-10-12 DIAGNOSIS — N182 Chronic kidney disease, stage 2 (mild): Secondary | ICD-10-CM | POA: Diagnosis not present

## 2016-04-15 DIAGNOSIS — N182 Chronic kidney disease, stage 2 (mild): Secondary | ICD-10-CM | POA: Diagnosis not present

## 2016-04-15 DIAGNOSIS — N08 Glomerular disorders in diseases classified elsewhere: Secondary | ICD-10-CM | POA: Diagnosis not present

## 2016-04-15 DIAGNOSIS — I129 Hypertensive chronic kidney disease with stage 1 through stage 4 chronic kidney disease, or unspecified chronic kidney disease: Secondary | ICD-10-CM | POA: Diagnosis not present

## 2016-04-15 DIAGNOSIS — Z23 Encounter for immunization: Secondary | ICD-10-CM | POA: Diagnosis not present

## 2016-04-15 DIAGNOSIS — E1122 Type 2 diabetes mellitus with diabetic chronic kidney disease: Secondary | ICD-10-CM | POA: Diagnosis not present

## 2016-04-22 DIAGNOSIS — N183 Chronic kidney disease, stage 3 (moderate): Secondary | ICD-10-CM | POA: Diagnosis not present

## 2016-04-22 DIAGNOSIS — E213 Hyperparathyroidism, unspecified: Secondary | ICD-10-CM | POA: Diagnosis not present

## 2016-04-30 DIAGNOSIS — N183 Chronic kidney disease, stage 3 (moderate): Secondary | ICD-10-CM | POA: Diagnosis not present

## 2016-04-30 DIAGNOSIS — E213 Hyperparathyroidism, unspecified: Secondary | ICD-10-CM | POA: Diagnosis not present

## 2016-04-30 DIAGNOSIS — I10 Essential (primary) hypertension: Secondary | ICD-10-CM | POA: Diagnosis not present

## 2016-04-30 DIAGNOSIS — D631 Anemia in chronic kidney disease: Secondary | ICD-10-CM | POA: Diagnosis not present

## 2016-05-21 DIAGNOSIS — E213 Hyperparathyroidism, unspecified: Secondary | ICD-10-CM | POA: Diagnosis not present

## 2016-08-12 DIAGNOSIS — H52223 Regular astigmatism, bilateral: Secondary | ICD-10-CM | POA: Diagnosis not present

## 2016-08-12 DIAGNOSIS — E119 Type 2 diabetes mellitus without complications: Secondary | ICD-10-CM | POA: Diagnosis not present

## 2016-08-26 DIAGNOSIS — N183 Chronic kidney disease, stage 3 (moderate): Secondary | ICD-10-CM | POA: Diagnosis not present

## 2016-08-26 DIAGNOSIS — E213 Hyperparathyroidism, unspecified: Secondary | ICD-10-CM | POA: Diagnosis not present

## 2016-09-03 DIAGNOSIS — I10 Essential (primary) hypertension: Secondary | ICD-10-CM | POA: Diagnosis not present

## 2016-09-03 DIAGNOSIS — N183 Chronic kidney disease, stage 3 (moderate): Secondary | ICD-10-CM | POA: Diagnosis not present

## 2016-09-03 DIAGNOSIS — E213 Hyperparathyroidism, unspecified: Secondary | ICD-10-CM | POA: Diagnosis not present

## 2016-09-17 DIAGNOSIS — E559 Vitamin D deficiency, unspecified: Secondary | ICD-10-CM | POA: Diagnosis not present

## 2016-09-17 DIAGNOSIS — Z0000 Encounter for general adult medical examination without abnormal findings: Secondary | ICD-10-CM | POA: Diagnosis not present

## 2016-09-17 DIAGNOSIS — E1122 Type 2 diabetes mellitus with diabetic chronic kidney disease: Secondary | ICD-10-CM | POA: Diagnosis not present

## 2016-09-17 DIAGNOSIS — I129 Hypertensive chronic kidney disease with stage 1 through stage 4 chronic kidney disease, or unspecified chronic kidney disease: Secondary | ICD-10-CM | POA: Diagnosis not present

## 2016-09-17 DIAGNOSIS — N08 Glomerular disorders in diseases classified elsewhere: Secondary | ICD-10-CM | POA: Diagnosis not present

## 2016-09-17 DIAGNOSIS — N182 Chronic kidney disease, stage 2 (mild): Secondary | ICD-10-CM | POA: Diagnosis not present

## 2016-09-23 ENCOUNTER — Other Ambulatory Visit: Payer: Self-pay | Admitting: Internal Medicine

## 2016-09-23 DIAGNOSIS — Z1231 Encounter for screening mammogram for malignant neoplasm of breast: Secondary | ICD-10-CM

## 2016-10-08 ENCOUNTER — Ambulatory Visit: Payer: Medicare Other

## 2016-10-25 ENCOUNTER — Ambulatory Visit
Admission: RE | Admit: 2016-10-25 | Discharge: 2016-10-25 | Disposition: A | Discharge status: Home / Self Care | Payer: Medicare Other | Source: Ambulatory Visit | Attending: Internal Medicine | Admitting: Internal Medicine

## 2016-10-25 DIAGNOSIS — Z1231 Encounter for screening mammogram for malignant neoplasm of breast: Secondary | ICD-10-CM | POA: Diagnosis not present

## 2016-12-10 DIAGNOSIS — N182 Chronic kidney disease, stage 2 (mild): Secondary | ICD-10-CM | POA: Diagnosis not present

## 2016-12-10 DIAGNOSIS — N08 Glomerular disorders in diseases classified elsewhere: Secondary | ICD-10-CM | POA: Diagnosis not present

## 2016-12-10 DIAGNOSIS — I129 Hypertensive chronic kidney disease with stage 1 through stage 4 chronic kidney disease, or unspecified chronic kidney disease: Secondary | ICD-10-CM | POA: Diagnosis not present

## 2016-12-10 DIAGNOSIS — E1122 Type 2 diabetes mellitus with diabetic chronic kidney disease: Secondary | ICD-10-CM | POA: Diagnosis not present

## 2016-12-25 DIAGNOSIS — N183 Chronic kidney disease, stage 3 (moderate): Secondary | ICD-10-CM | POA: Diagnosis not present

## 2016-12-25 DIAGNOSIS — E213 Hyperparathyroidism, unspecified: Secondary | ICD-10-CM | POA: Diagnosis not present

## 2016-12-30 DIAGNOSIS — E213 Hyperparathyroidism, unspecified: Secondary | ICD-10-CM | POA: Diagnosis not present

## 2016-12-30 DIAGNOSIS — N183 Chronic kidney disease, stage 3 (moderate): Secondary | ICD-10-CM | POA: Diagnosis not present

## 2016-12-30 DIAGNOSIS — I10 Essential (primary) hypertension: Secondary | ICD-10-CM | POA: Diagnosis not present

## 2016-12-30 DIAGNOSIS — D631 Anemia in chronic kidney disease: Secondary | ICD-10-CM | POA: Diagnosis not present

## 2017-04-17 DIAGNOSIS — I129 Hypertensive chronic kidney disease with stage 1 through stage 4 chronic kidney disease, or unspecified chronic kidney disease: Secondary | ICD-10-CM | POA: Diagnosis not present

## 2017-04-17 DIAGNOSIS — Z23 Encounter for immunization: Secondary | ICD-10-CM | POA: Diagnosis not present

## 2017-04-17 DIAGNOSIS — E1122 Type 2 diabetes mellitus with diabetic chronic kidney disease: Secondary | ICD-10-CM | POA: Diagnosis not present

## 2017-04-17 DIAGNOSIS — N182 Chronic kidney disease, stage 2 (mild): Secondary | ICD-10-CM | POA: Diagnosis not present

## 2017-04-17 DIAGNOSIS — N08 Glomerular disorders in diseases classified elsewhere: Secondary | ICD-10-CM | POA: Diagnosis not present

## 2017-07-28 DIAGNOSIS — E213 Hyperparathyroidism, unspecified: Secondary | ICD-10-CM | POA: Diagnosis not present

## 2017-07-28 DIAGNOSIS — N183 Chronic kidney disease, stage 3 (moderate): Secondary | ICD-10-CM | POA: Diagnosis not present

## 2017-07-28 DIAGNOSIS — N189 Chronic kidney disease, unspecified: Secondary | ICD-10-CM | POA: Diagnosis not present

## 2017-08-06 DIAGNOSIS — N183 Chronic kidney disease, stage 3 (moderate): Secondary | ICD-10-CM | POA: Diagnosis not present

## 2017-08-06 DIAGNOSIS — I129 Hypertensive chronic kidney disease with stage 1 through stage 4 chronic kidney disease, or unspecified chronic kidney disease: Secondary | ICD-10-CM | POA: Diagnosis not present

## 2017-08-06 DIAGNOSIS — E213 Hyperparathyroidism, unspecified: Secondary | ICD-10-CM | POA: Diagnosis not present

## 2017-08-06 DIAGNOSIS — D631 Anemia in chronic kidney disease: Secondary | ICD-10-CM | POA: Diagnosis not present

## 2017-08-25 DIAGNOSIS — E119 Type 2 diabetes mellitus without complications: Secondary | ICD-10-CM | POA: Diagnosis not present

## 2017-08-26 DIAGNOSIS — I129 Hypertensive chronic kidney disease with stage 1 through stage 4 chronic kidney disease, or unspecified chronic kidney disease: Secondary | ICD-10-CM | POA: Diagnosis not present

## 2017-08-26 DIAGNOSIS — N08 Glomerular disorders in diseases classified elsewhere: Secondary | ICD-10-CM | POA: Diagnosis not present

## 2017-08-26 DIAGNOSIS — N182 Chronic kidney disease, stage 2 (mild): Secondary | ICD-10-CM | POA: Diagnosis not present

## 2017-08-26 DIAGNOSIS — E1122 Type 2 diabetes mellitus with diabetic chronic kidney disease: Secondary | ICD-10-CM | POA: Diagnosis not present

## 2017-09-04 ENCOUNTER — Other Ambulatory Visit: Payer: Self-pay | Admitting: Internal Medicine

## 2017-09-04 DIAGNOSIS — E2839 Other primary ovarian failure: Secondary | ICD-10-CM

## 2017-09-05 ENCOUNTER — Other Ambulatory Visit: Payer: Self-pay | Admitting: Internal Medicine

## 2017-09-05 DIAGNOSIS — Z1231 Encounter for screening mammogram for malignant neoplasm of breast: Secondary | ICD-10-CM

## 2017-09-26 DIAGNOSIS — E119 Type 2 diabetes mellitus without complications: Secondary | ICD-10-CM | POA: Diagnosis not present

## 2017-10-27 ENCOUNTER — Ambulatory Visit
Admission: RE | Admit: 2017-10-27 | Discharge: 2017-10-27 | Disposition: A | Discharge status: Home / Self Care | Payer: Medicare Other | Source: Ambulatory Visit | Attending: Internal Medicine | Admitting: Internal Medicine

## 2017-10-27 DIAGNOSIS — Z780 Asymptomatic menopausal state: Secondary | ICD-10-CM | POA: Diagnosis not present

## 2017-10-27 DIAGNOSIS — E2839 Other primary ovarian failure: Secondary | ICD-10-CM

## 2017-10-27 DIAGNOSIS — Z1231 Encounter for screening mammogram for malignant neoplasm of breast: Secondary | ICD-10-CM | POA: Diagnosis not present

## 2017-10-27 DIAGNOSIS — M85851 Other specified disorders of bone density and structure, right thigh: Secondary | ICD-10-CM | POA: Diagnosis not present

## 2017-12-23 DIAGNOSIS — Z1389 Encounter for screening for other disorder: Secondary | ICD-10-CM | POA: Diagnosis not present

## 2017-12-23 DIAGNOSIS — N182 Chronic kidney disease, stage 2 (mild): Secondary | ICD-10-CM | POA: Diagnosis not present

## 2017-12-23 DIAGNOSIS — E1122 Type 2 diabetes mellitus with diabetic chronic kidney disease: Secondary | ICD-10-CM | POA: Diagnosis not present

## 2017-12-23 DIAGNOSIS — N08 Glomerular disorders in diseases classified elsewhere: Secondary | ICD-10-CM | POA: Diagnosis not present

## 2017-12-23 DIAGNOSIS — I129 Hypertensive chronic kidney disease with stage 1 through stage 4 chronic kidney disease, or unspecified chronic kidney disease: Secondary | ICD-10-CM | POA: Diagnosis not present

## 2018-04-21 DIAGNOSIS — E1122 Type 2 diabetes mellitus with diabetic chronic kidney disease: Secondary | ICD-10-CM | POA: Diagnosis not present

## 2018-04-21 DIAGNOSIS — Z23 Encounter for immunization: Secondary | ICD-10-CM | POA: Diagnosis not present

## 2018-04-21 DIAGNOSIS — N08 Glomerular disorders in diseases classified elsewhere: Secondary | ICD-10-CM | POA: Diagnosis not present

## 2018-04-21 DIAGNOSIS — I129 Hypertensive chronic kidney disease with stage 1 through stage 4 chronic kidney disease, or unspecified chronic kidney disease: Secondary | ICD-10-CM

## 2018-04-21 DIAGNOSIS — Z79899 Other long term (current) drug therapy: Secondary | ICD-10-CM

## 2018-04-21 DIAGNOSIS — Z6826 Body mass index (BMI) 26.0-26.9, adult: Secondary | ICD-10-CM

## 2018-04-21 DIAGNOSIS — N182 Chronic kidney disease, stage 2 (mild): Secondary | ICD-10-CM | POA: Diagnosis not present

## 2018-05-07 ENCOUNTER — Other Ambulatory Visit: Payer: Self-pay | Admitting: Internal Medicine

## 2018-06-01 ENCOUNTER — Other Ambulatory Visit: Payer: Self-pay | Admitting: Internal Medicine

## 2018-08-12 ENCOUNTER — Other Ambulatory Visit: Payer: Self-pay | Admitting: Internal Medicine

## 2018-08-24 ENCOUNTER — Ambulatory Visit (INDEPENDENT_AMBULATORY_CARE_PROVIDER_SITE_OTHER): Payer: Medicare Other | Admitting: Internal Medicine

## 2018-08-24 ENCOUNTER — Encounter: Payer: Self-pay | Admitting: Internal Medicine

## 2018-08-24 VITALS — BP 116/72 | HR 98 | Temp 98.1°F | Ht 65.25 in | Wt 154.8 lb

## 2018-08-24 DIAGNOSIS — N182 Chronic kidney disease, stage 2 (mild): Secondary | ICD-10-CM

## 2018-08-24 DIAGNOSIS — E7800 Pure hypercholesterolemia, unspecified: Secondary | ICD-10-CM

## 2018-08-24 DIAGNOSIS — K219 Gastro-esophageal reflux disease without esophagitis: Secondary | ICD-10-CM | POA: Diagnosis not present

## 2018-08-24 DIAGNOSIS — H6121 Impacted cerumen, right ear: Secondary | ICD-10-CM | POA: Diagnosis not present

## 2018-08-24 DIAGNOSIS — E1122 Type 2 diabetes mellitus with diabetic chronic kidney disease: Secondary | ICD-10-CM | POA: Insufficient documentation

## 2018-08-24 DIAGNOSIS — I129 Hypertensive chronic kidney disease with stage 1 through stage 4 chronic kidney disease, or unspecified chronic kidney disease: Secondary | ICD-10-CM

## 2018-08-24 MED ORDER — LINAGLIPTIN 5 MG PO TABS: 5.0000 mg | ORAL_TABLET | Freq: Every day | ORAL | 1 refills | Status: DC

## 2018-08-24 MED ORDER — FAMOTIDINE 20 MG PO TABS: 20.0000 mg | ORAL_TABLET | Freq: Two times a day (BID) | ORAL | 1 refills | Status: DC

## 2018-08-24 NOTE — Patient Instructions (Signed)
Earwax Buildup, Adult  The ears produce a substance called earwax that helps keep bacteria out of the ear and protects the skin in the ear canal. Occasionally, earwax can build up in the ear and cause discomfort or hearing loss.  What increases the risk?  This condition is more likely to develop in people who:  · Are female.  · Are elderly.  · Naturally produce more earwax.  · Clean their ears often with cotton swabs.  · Use earplugs often.  · Use in-ear headphones often.  · Wear hearing aids.  · Have narrow ear canals.  · Have earwax that is overly thick or sticky.  · Have eczema.  · Are dehydrated.  · Have excess hair in the ear canal.  What are the signs or symptoms?  Symptoms of this condition include:  · Reduced or muffled hearing.  · A feeling of fullness in the ear or feeling that the ear is plugged.  · Fluid coming from the ear.  · Ear pain.  · Ear itch.  · Ringing in the ear.  · Coughing.  · An obvious piece of earwax that can be seen inside the ear canal.  How is this diagnosed?  This condition may be diagnosed based on:  · Your symptoms.  · Your medical history.  · An ear exam. During the exam, your health care provider will look into your ear with an instrument called an otoscope.  You may have tests, including a hearing test.  How is this treated?  This condition may be treated by:  · Using ear drops to soften the earwax.  · Having the earwax removed by a health care provider. The health care provider may:  ? Flush the ear with water.  ? Use an instrument that has a loop on the end (curette).  ? Use a suction device.  · Surgery to remove the wax buildup. This may be done in severe cases.  Follow these instructions at home:    · Take over-the-counter and prescription medicines only as told by your health care provider.  · Do not put any objects, including cotton swabs, into your ear. You can clean the opening of your ear canal with a washcloth or facial tissue.  · Follow instructions from your health care  provider about cleaning your ears. Do not over-clean your ears.  · Drink enough fluid to keep your urine clear or pale yellow. This will help to thin the earwax.  · Keep all follow-up visits as told by your health care provider. If earwax builds up in your ears often or if you use hearing aids, consider seeing your health care provider for routine, preventive ear cleanings. Ask your health care provider how often you should schedule your cleanings.  · If you have hearing aids, clean them according to instructions from the manufacturer and your health care provider.  Contact a health care provider if:  · You have ear pain.  · You develop a fever.  · You have blood, pus, or other fluid coming from your ear.  · You have hearing loss.  · You have ringing in your ears that does not go away.  · Your symptoms do not improve with treatment.  · You feel like the room is spinning (vertigo).  Summary  · Earwax can build up in the ear and cause discomfort or hearing loss.  · The most common symptoms of this condition include reduced or muffled hearing and a feeling of   fullness in the ear or feeling that the ear is plugged.  · This condition may be diagnosed based on your symptoms, your medical history, and an ear exam.  · This condition may be treated by using ear drops to soften the earwax or by having the earwax removed by a health care provider.  · Do not put any objects, including cotton swabs, into your ear. You can clean the opening of your ear canal with a washcloth or facial tissue.  This information is not intended to replace advice given to you by your health care provider. Make sure you discuss any questions you have with your health care provider.  Document Released: 08/22/2004 Document Revised: 06/26/2017 Document Reviewed: 09/25/2016  Elsevier Interactive Patient Education © 2019 [employer]

## 2018-08-24 NOTE — Progress Notes (Signed)
Subjective:     Patient ID: Jean [name] , female    DOB: 1941/02/19 , 78 y.o.   MRN: [id number]   Chief Complaint  Patient presents with  . Diabetes  . Hypertension    HPI  She states she has been out of Tradjenta b/c donut hole.   Diabetes  She presents for her follow-up diabetic visit. She has type 2 diabetes mellitus. Her disease course has been worsening. There are no hypoglycemic associated symptoms. Pertinent negatives for diabetes include no blurred vision and no chest pain. There are no hypoglycemic complications. Diabetic complications include nephropathy. Risk factors for coronary artery disease include diabetes mellitus, dyslipidemia, hypertension and post-menopausal. Current diabetic treatment includes oral agent (monotherapy). She is compliant with treatment most of the time.  Hypertension  This is a chronic problem. The current episode started more than 1 year ago. The problem has been gradually improving since onset. The problem is controlled. Pertinent negatives include no blurred vision, chest pain, palpitations or shortness of breath.  She reports compliance with meds.   Past Medical History:  Diagnosis Date  . Blood transfusion without reported diagnosis    No reaction   . Diabetes mellitus without complication ([place])   . Hypertension      Family History  Problem Relation Age of Onset  . Breast cancer Sister   . Breast cancer Sister   . Healthy Mother   . Healthy Father      Current Outpatient Medications:  .  amLODipine (NORVASC) 10 MG tablet, TAKE ONE TABLET BY MOUTH EVERY DAY, Disp: 90 tablet, Rfl: 1 .  aspirin EC 81 MG tablet, Take 81 mg by mouth daily., Disp: , Rfl:  .  benazepril (LOTENSIN) 20 MG tablet, TAKE ONE TABLET BY MOUTH EVERY DAY, Disp: 90 tablet, Rfl: 1 .  cholecalciferol (VITAMIN D) 1000 UNITS tablet, Take 1,000 Units by mouth daily., Disp: , Rfl:  .  cinacalcet (SENSIPAR) 30 MG tablet, Take 30 mg by mouth daily., Disp: , Rfl:  .   rosuvastatin (CRESTOR) 20 MG tablet, Take 20 mg by mouth daily., Disp: , Rfl:  .  diphenhydrAMINE (BENADRYL) 25 MG tablet, Take 25 mg by mouth every 6 (six) hours as needed for itching, allergies or sleep., Disp: , Rfl:  .  famotidine (PEPCID) 20 MG tablet, Take 1 tablet (20 mg total) by mouth 2 (two) times daily., Disp: 60 tablet, Rfl: 1 .  linagliptin (TRADJENTA) 5 MG TABS tablet, Take 1 tablet (5 mg total) by mouth daily., Disp: 90 tablet, Rfl: 1   Allergies  Allergen Reactions  . Penicillins Anaphylaxis     Review of Systems  Constitutional: Negative.   HENT: Positive for hearing loss (she wants to have her R ear looked at. she has noticed decreased hearing).   Eyes: Negative for blurred vision.  Respiratory: Negative.  Negative for shortness of breath.   Cardiovascular: Negative.  Negative for chest pain and palpitations.  Gastrointestinal: Negative.        Her reflux sx have returned since stopping ranitidine. States pharmacist told her to stop taking it. She wants to take something in its place.   Neurological: Negative.   Psychiatric/Behavioral: Negative.      Today's Vitals   08/24/18 0845  BP: 116/72  Pulse: 98  Temp: 98.1 F (36.7 C)  TempSrc: Oral  SpO2: 99%  Weight: 154 lb 12.8 oz (70.2 kg)  Height: 5' 5.25" (1.657 m)  PainSc: 10-Worst pain ever  PainLoc: Knee  Body mass index is 25.56 kg/m.   Objective:  Physical Exam Vitals signs and nursing note reviewed.  Constitutional:      Appearance: Normal appearance.  HENT:     Head: Normocephalic and atraumatic.     Right Ear: Ear canal and external ear normal. There is impacted cerumen.     Left Ear: Tympanic membrane, ear canal and external ear normal.  Cardiovascular:     Rate and Rhythm: Normal rate and regular rhythm.     Heart sounds: Normal heart sounds.  Pulmonary:     Effort: Pulmonary effort is normal.     Breath sounds: Normal breath sounds.  Skin:    General: Skin is warm.     Capillary  Refill: Capillary refill takes less than 2 seconds.  Neurological:     General: No focal deficit present.     Mental Status: She is alert and oriented to person, place, and time.         Assessment And Plan:     1. Type 2 diabetes mellitus with stage 2 chronic kidney disease, without long-term current use of insulin ([place])  I will check labs as listed below. New rx for Tradjenta was sent to the pharmacy. She is also followed by [facility] for CKD.   - CMP14+EGFR - Hemoglobin A1c - Lipid Profile  2. Hypertensive nephropathy  Well controlled. She will continue with current meds. She is encouraged to avoid adding salt to her foods.   3. Gastroesophageal reflux disease without esophagitis  Her sx have returned since stopping ranitidine. Rx pepcid twice daily was sent to the pharmacy.   4. Pure hypercholesterolemia  I will check a fasting lipid panel today. She is encouraged to limit her intake of fried foods and to continue to exercise on a regular basis.   5. Hearing loss of right ear due to cerumen impaction  After obtaining verbal consent, right ear was irrigated without complications. No TM abnormalities were noted.   - Ear Lavage        [name], MD

## 2018-08-25 LAB — CMP14+EGFR
ALT: 19 IU/L (ref 0–32)
AST: 13 IU/L (ref 0–40)
Albumin/Globulin Ratio: 1.6 (ref 1.2–2.2)
Albumin: 5.1 g/dL — ABNORMAL HIGH (ref 3.7–4.7)
Alkaline Phosphatase: 139 IU/L — ABNORMAL HIGH (ref 39–117)
BUN/Creatinine Ratio: 16 (ref 12–28)
BUN: 17 mg/dL (ref 8–27)
Bilirubin Total: 0.6 mg/dL (ref 0.0–1.2)
CO2: 21 mmol/L (ref 20–29)
Calcium: 11.8 mg/dL — ABNORMAL HIGH (ref 8.7–10.3)
Chloride: 99 mmol/L (ref 96–106)
Creatinine, Ser: 1.05 mg/dL — ABNORMAL HIGH (ref 0.57–1.00)
GFR calc Af Amer: 59 mL/min/{1.73_m2} — ABNORMAL LOW (ref >59)
GFR calc non Af Amer: 51 mL/min/{1.73_m2} — ABNORMAL LOW (ref >59)
Globulin, Total: 3.2 g/dL (ref 1.5–4.5)
Glucose: 114 mg/dL — ABNORMAL HIGH (ref 65–99)
Potassium: 4.4 mmol/L (ref 3.5–5.2)
Sodium: 139 mmol/L (ref 134–144)
Total Protein: 8.3 g/dL (ref 6.0–8.5)

## 2018-08-25 LAB — LIPID PANEL
Chol/HDL Ratio: 3.4 ratio (ref 0.0–4.4)
Cholesterol, Total: 202 mg/dL — ABNORMAL HIGH (ref 100–199)
HDL: 59 mg/dL (ref >39)
LDL Calculated: 112 mg/dL — ABNORMAL HIGH (ref 0–99)
Triglycerides: 155 mg/dL — ABNORMAL HIGH (ref 0–149)
VLDL Cholesterol Cal: 31 mg/dL (ref 5–40)

## 2018-08-25 LAB — HEMOGLOBIN A1C
Est. average glucose Bld gHb Est-mCnc: 214 mg/dL
Hgb A1c MFr Bld: 9.1 % — ABNORMAL HIGH (ref 4.8–5.6)

## 2018-08-31 DIAGNOSIS — E119 Type 2 diabetes mellitus without complications: Secondary | ICD-10-CM | POA: Diagnosis not present

## 2018-08-31 LAB — HM DIABETES EYE EXAM

## 2018-10-28 ENCOUNTER — Encounter: Payer: Self-pay | Admitting: Internal Medicine

## 2018-10-28 ENCOUNTER — Ambulatory Visit: Payer: Self-pay

## 2018-10-28 ENCOUNTER — Ambulatory Visit: Payer: Self-pay | Admitting: Internal Medicine

## 2018-10-29 ENCOUNTER — Ambulatory Visit: Payer: Self-pay

## 2018-10-29 ENCOUNTER — Ambulatory Visit: Payer: Self-pay | Admitting: Internal Medicine

## 2018-11-13 ENCOUNTER — Other Ambulatory Visit: Payer: Self-pay | Admitting: Internal Medicine

## 2018-11-23 DIAGNOSIS — N183 Chronic kidney disease, stage 3 (moderate): Secondary | ICD-10-CM | POA: Diagnosis not present

## 2018-11-30 DIAGNOSIS — N183 Chronic kidney disease, stage 3 (moderate): Secondary | ICD-10-CM | POA: Diagnosis not present

## 2018-11-30 DIAGNOSIS — E213 Hyperparathyroidism, unspecified: Secondary | ICD-10-CM | POA: Diagnosis not present

## 2018-11-30 DIAGNOSIS — I129 Hypertensive chronic kidney disease with stage 1 through stage 4 chronic kidney disease, or unspecified chronic kidney disease: Secondary | ICD-10-CM | POA: Diagnosis not present

## 2018-11-30 DIAGNOSIS — D631 Anemia in chronic kidney disease: Secondary | ICD-10-CM | POA: Diagnosis not present

## 2018-12-01 ENCOUNTER — Other Ambulatory Visit: Payer: Self-pay

## 2018-12-01 ENCOUNTER — Ambulatory Visit (INDEPENDENT_AMBULATORY_CARE_PROVIDER_SITE_OTHER): Payer: Medicare Other

## 2018-12-01 VITALS — Ht 66.5 in | Wt 158.0 lb

## 2018-12-01 DIAGNOSIS — Z0000 Encounter for general adult medical examination without abnormal findings: Secondary | ICD-10-CM

## 2018-12-01 NOTE — Patient Instructions (Signed)
Jean [name] , Thank you for taking time to come for your Medicare Wellness Visit. I appreciate your ongoing commitment to your health goals. Please review the following plan we discussed and let me know if I can assist you in the future.   Screening recommendations/referrals: Colonoscopy: not required Mammogram: reschedule after [place] Bone Density: 10/2017 Recommended yearly ophthalmology/optometry visit for glaucoma screening and checkup Recommended yearly dental visit for hygiene and checkup  Vaccinations: Influenza vaccine: 03/2018 Pneumococcal vaccine: 03/2012 Tdap vaccine: due Shingles vaccine: discussed    Advanced directives: Advance directive discussed with you today.  Conditions/risks identified: Overweight  Next appointment: 12/09/2018 at 10:30   Preventive Care 79 Years and Older, Female Preventive care refers to lifestyle choices and visits with your health care provider that can promote health and wellness. What does preventive care include?  A yearly physical exam. This is also called an annual well check.  Dental exams once or twice a year.  Routine eye exams. Ask your health care provider how often you should have your eyes checked.  Personal lifestyle choices, including:  Daily care of your teeth and gums.  Regular physical activity.  Eating a healthy diet.  Avoiding tobacco and drug use.  Limiting alcohol use.  Practicing safe sex.  Taking low-dose aspirin every day.  Taking vitamin and mineral supplements as recommended by your health care provider. What happens during an annual well check? The services and screenings done by your health care provider during your annual well check will depend on your age, overall health, lifestyle risk factors, and family history of disease. Counseling  Your health care provider may ask you questions about your:  Alcohol use.  Tobacco use.  Drug use.  Emotional well-being.  Home and relationship  well-being.  Sexual activity.  Eating habits.  History of falls.  Memory and ability to understand (cognition).  Work and work Astronomer.  Reproductive health. Screening  You may have the following tests or measurements:  Height, weight, and BMI.  Blood pressure.  Lipid and cholesterol levels. These may be checked every 5 years, or more frequently if you are over 28 years old.  Skin check.  Lung cancer screening. You may have this screening every year starting at age 24 if you have a 30-pack-year history of smoking and currently smoke or have quit within the past 15 years.  Fecal occult blood test (FOBT) of the stool. You may have this test every year starting at age 105.  Flexible sigmoidoscopy or colonoscopy. You may have a sigmoidoscopy every 5 years or a colonoscopy every 10 years starting at age 52.  Hepatitis C blood test.  Hepatitis B blood test.  Sexually transmitted disease (STD) testing.  Diabetes screening. This is done by checking your blood sugar (glucose) after you have not eaten for a while (fasting). You may have this done every 1-3 years.  Bone density scan. This is done to screen for osteoporosis. You may have this done starting at age 41.  Mammogram. This may be done every 1-2 years. Talk to your health care provider about how often you should have regular mammograms. Talk with your health care provider about your test results, treatment options, and if necessary, the need for more tests. Vaccines  Your health care provider may recommend certain vaccines, such as:  Influenza vaccine. This is recommended every year.  Tetanus, diphtheria, and acellular pertussis (Tdap, Td) vaccine. You may need a Td booster every 10 years.  Zoster vaccine. You may need this  after age 18.  Pneumococcal 13-valent conjugate (PCV13) vaccine. One dose is recommended after age 74.  Pneumococcal polysaccharide (PPSV23) vaccine. One dose is recommended after age 47.  Talk to your health care provider about which screenings and vaccines you need and how often you need them. This information is not intended to replace advice given to you by your health care provider. Make sure you discuss any questions you have with your health care provider. Document Released: 08/11/2015 Document Revised: 04/03/2016 Document Reviewed: 05/16/2015 Elsevier Interactive Patient Education  2017 [place] Prevention in the Home Falls can cause injuries. They can happen to people of all ages. There are many things you can do to make your home safe and to help prevent falls. What can I do on the outside of my home?  Regularly fix the edges of walkways and driveways and fix any cracks.  Remove anything that might make you trip as you walk through a door, such as a raised step or threshold.  Trim any bushes or trees on the path to your home.  Use bright outdoor lighting.  Clear any walking paths of anything that might make someone trip, such as rocks or tools.  Regularly check to see if handrails are loose or broken. Make sure that both sides of any steps have handrails.  Any raised decks and porches should have guardrails on the edges.  Have any leaves, snow, or ice cleared regularly.  Use sand or salt on walking paths during winter.  Clean up any spills in your garage right away. This includes oil or grease spills. What can I do in the bathroom?  Use night lights.  Install grab bars by the toilet and in the tub and shower. Do not use towel bars as grab bars.  Use non-skid mats or decals in the tub or shower.  If you need to sit down in the shower, use a plastic, non-slip stool.  Keep the floor dry. Clean up any water that spills on the floor as soon as it happens.  Remove soap buildup in the tub or shower regularly.  Attach bath mats securely with double-sided non-slip rug tape.  Do not have throw rugs and other things on the floor that can make you  trip. What can I do in the bedroom?  Use night lights.  Make sure that you have a light by your bed that is easy to reach.  Do not use any sheets or blankets that are too big for your bed. They should not hang down onto the floor.  Have a firm chair that has side arms. You can use this for support while you get dressed.  Do not have throw rugs and other things on the floor that can make you trip. What can I do in the kitchen?  Clean up any spills right away.  Avoid walking on wet floors.  Keep items that you use a lot in easy-to-reach places.  If you need to reach something above you, use a strong step stool that has a grab bar.  Keep electrical cords out of the way.  Do not use floor polish or wax that makes floors slippery. If you must use wax, use non-skid floor wax.  Do not have throw rugs and other things on the floor that can make you trip. What can I do with my stairs?  Do not leave any items on the stairs.  Make sure that there are handrails on both sides of the  stairs and use them. Fix handrails that are broken or loose. Make sure that handrails are as long as the stairways.  Check any carpeting to make sure that it is firmly attached to the stairs. Fix any carpet that is loose or worn.  Avoid having throw rugs at the top or bottom of the stairs. If you do have throw rugs, attach them to the floor with carpet tape.  Make sure that you have a light switch at the top of the stairs and the bottom of the stairs. If you do not have them, ask someone to add them for you. What else can I do to help prevent falls?  Wear shoes that:  Do not have high heels.  Have rubber bottoms.  Are comfortable and fit you well.  Are closed at the toe. Do not wear sandals.  If you use a stepladder:  Make sure that it is fully opened. Do not climb a closed stepladder.  Make sure that both sides of the stepladder are locked into place.  Ask someone to hold it for you, if  possible.  Clearly mark and make sure that you can see:  Any grab bars or handrails.  First and last steps.  Where the edge of each step is.  Use tools that help you move around (mobility aids) if they are needed. These include:  Canes.  Walkers.  Scooters.  Crutches.  Turn on the lights when you go into a dark area. Replace any light bulbs as soon as they burn out.  Set up your furniture so you have a clear path. Avoid moving your furniture around.  If any of your floors are uneven, fix them.  If there are any pets around you, be aware of where they are.  Review your medicines with your doctor. Some medicines can make you feel dizzy. This can increase your chance of falling. Ask your doctor what other things that you can do to help prevent falls. This information is not intended to replace advice given to you by your health care provider. Make sure you discuss any questions you have with your health care provider. Document Released: 05/11/2009 Document Revised: 12/21/2015 Document Reviewed: 08/19/2014 Elsevier Interactive Patient Education  2017 [employer].

## 2018-12-01 NOTE — Progress Notes (Signed)
Subjective:   Jean [name] is a 78 y.o. female who presents for Medicare Annual (Subsequent) preventive examination.  This visit type was conducted due to national recommendations for restrictions regarding the COVID-19 Pandemic (e.g. social distancing). This format is felt to be most appropriate for this patient at this time. All issues noted in this document were discussed and addressed. No physical exam was performed (except for noted visual exam findings with Video Visits). The patient, Jean [name],has given consent to perform this visit via video. Vital signs may be absent or patient reported.   Patient location:  At home   Nurse location:  [facility] office   Review of Systems:  n/a Cardiac Risk Factors include: advanced age (>70men, >67 women);diabetes mellitus;hypertension     Objective:     Vitals: Ht 5' 6.5" (1.689 m) Comment: per patient report  Wt 158 lb (71.7 kg) Comment: per patient report  BMI 25.12 kg/m   Body mass index is 25.12 kg/m.  Advanced Directives 12/01/2018  Does Patient Have a Medical Advance Directive? No    Tobacco Social History   Tobacco Use  Smoking Status Never Smoker  Smokeless Tobacco Never Used     Counseling given: Not Answered   Clinical Intake:  Pre-visit preparation completed: Yes  Pain : No/denies pain     Nutritional Status: BMI 25 -29 Overweight Nutritional Risks: None Diabetes: Yes CBG done?: No Did pt. bring in CBG monitor from home?: No  How often do you need to have someone help you when you read instructions, pamphlets, or other written materials from your doctor or pharmacy?: 1 - Never What is the last grade level you completed in school?: 12th grade  Interpreter Needed?: No  Information entered by :: NAllen LPN  Past Medical History:  Diagnosis Date  . Blood transfusion without reported diagnosis    No reaction   . Diabetes mellitus without complication (HCC)   . Hypertension    Past Surgical  History:  Procedure Laterality Date  . ABDOMINAL HYSTERECTOMY    . BACK SURGERY    . BREAST EXCISIONAL BIOPSY    . BREAST SURGERY Bilateral    lumpectomy  . NEPHRECTOMY Right   . WRIST SURGERY     rt   Family History  Problem Relation Age of Onset  . Breast cancer Sister   . Breast cancer Sister   . Healthy Mother   . Healthy Father    Social History   Socioeconomic History  . Marital status: Married    Spouse name: Not on file  . Number of children: Not on file  . Years of education: Not on file  . Highest education level: Not on file  Occupational History  . Occupation: retired  Engineer, production  . Financial resource strain: Not hard at all  . Food insecurity:    Worry: Never true    Inability: Never true  . Transportation needs:    Medical: No    Non-medical: No  Tobacco Use  . Smoking status: Never Smoker  . Smokeless tobacco: Never Used  Substance and Sexual Activity  . Alcohol use: No  . Drug use: No  . Sexual activity: Not Currently  Lifestyle  . Physical activity:    Days per week: 7 days    Minutes per session: 10 min  . Stress: Not at all  Relationships  . Social connections:    Talks on phone: Not on file    Gets together: Not on file  Attends religious service: Not on file    Active member of club or organization: Not on file    Attends meetings of clubs or organizations: Not on file    Relationship status: Not on file  Other Topics Concern  . Not on file  Social History Narrative  . Not on file    Outpatient Encounter Medications as of 12/01/2018  Medication Sig  . amLODipine (NORVASC) 10 MG tablet TAKE ONE TABLET BY MOUTH EVERY DAY  . aspirin EC 81 MG tablet Take 81 mg by mouth daily.  . benazepril (LOTENSIN) 20 MG tablet TAKE ONE TABLET BY MOUTH EVERY DAY  . cholecalciferol (VITAMIN D) 1000 UNITS tablet Take 1,000 Units by mouth daily.  . cinacalcet (SENSIPAR) 30 MG tablet Take 30 mg by mouth daily.  . diphenhydrAMINE (BENADRYL) 25 MG  tablet Take 25 mg by mouth every 6 (six) hours as needed for itching, allergies or sleep.  . famotidine (PEPCID) 20 MG tablet Take 1 tablet (20 mg total) by mouth 2 (two) times daily.  . rosuvastatin (CRESTOR) 20 MG tablet Take 20 mg by mouth daily.  . TRADJENTA 5 MG TABS tablet TAKE ONE TABLET BY MOUTH ONCE DAILY   No facility-administered encounter medications on file as of 12/01/2018.     Activities of Daily Living In your present state of health, do you have any difficulty performing the following activities: 12/01/2018  Hearing? N  Vision? N  Difficulty concentrating or making decisions? N  Walking or climbing stairs? N  Dressing or bathing? N  Doing errands, shopping? N  Preparing Food and eating ? N  Using the Toilet? N  In the past six months, have you accidently leaked urine? N  Do you have problems with loss of bowel control? N  Managing your Medications? N  Managing your Finances? N  Housekeeping or managing your Housekeeping? N  Some recent data might be hidden    Patient Care Team: [name], MD as PCP - General (Internal Medicine)    Assessment:   This is a routine wellness examination for [name].  Exercise Activities and Dietary recommendations Current Exercise Habits: Home exercise routine, Time (Minutes): 10, Frequency (Times/Week): 7, Weekly Exercise (Minutes/Week): 70, Intensity: Moderate  Goals    . Patient Stated     Stay the same       Fall Risk Fall Risk  12/01/2018 08/24/2018  Falls in the past year? 0 0  Risk for fall due to : Medication side effect -  Follow up Falls prevention discussed -   Is the patient's home free of loose throw rugs in walkways, pet beds, electrical cords, etc?   yes      Grab bars in the bathroom? no      Handrails on the stairs?   yes      Adequate lighting?   yes  Timed Get Up and Go performed: n/a  Depression Screen PHQ 2/9 Scores 12/01/2018 08/24/2018  PHQ - 2 Score 0 0  PHQ- 9 Score 0 -     Cognitive  Function        Immunization History  Administered Date(s) Administered  . Influenza-Unspecified 04/28/2014    Qualifies for Shingles Vaccine? yes  Screening Tests Health Maintenance  Topic Date Due  . FOOT EXAM  12/27/1949  . OPHTHALMOLOGY EXAM  12/27/1949  . TETANUS/TDAP  12/28/1958  . PNA vac Low Risk Adult (2 of 2 - PPSV23) 04/14/2013  . HEMOGLOBIN A1C  02/22/2019  . INFLUENZA VACCINE  02/27/2019  . DEXA SCAN  Completed    Cancer Screenings: Lung: Low Dose CT Chest recommended if Age 65-80 years, 30 pack-year currently smoking OR have quit w/in 15years. Patient does not qualify. Breast:  Up to date on Mammogram? No   Up to date of Bone Density/Dexa? Yes Colorectal: not required  Additional Screenings: : Hepatitis C Screening: n/a     Plan:   Mammogram cancelled due to quarantine. States had diabetic eye exam in November.   I have personally reviewed and noted the following in the patient's chart:   . Medical and social history . Use of alcohol, tobacco or illicit drugs  . Current medications and supplements . Functional ability and status . Nutritional status . Physical activity . Advanced directives . List of other physicians . Hospitalizations, surgeries, and ER visits in previous 12 months . Vitals . Screenings to include cognitive, depression, and falls . Referrals and appointments  In addition, I have reviewed and discussed with patient certain preventive protocols, quality metrics, and best practice recommendations. A written personalized care plan for preventive services as well as general preventive health recommendations were provided to patient.     Barb Merinoickeah E Cameron Katayama, LPN  1/6/10965/11/2018

## 2018-12-09 ENCOUNTER — Other Ambulatory Visit: Payer: Self-pay

## 2018-12-09 ENCOUNTER — Ambulatory Visit: Payer: Medicare Other

## 2018-12-09 ENCOUNTER — Encounter: Payer: Self-pay | Admitting: Internal Medicine

## 2018-12-09 ENCOUNTER — Ambulatory Visit (INDEPENDENT_AMBULATORY_CARE_PROVIDER_SITE_OTHER): Payer: Medicare Other | Admitting: Internal Medicine

## 2018-12-09 VITALS — BP 146/82 | HR 99 | Temp 98.1°F | Ht 66.5 in | Wt 153.0 lb

## 2018-12-09 DIAGNOSIS — E1122 Type 2 diabetes mellitus with diabetic chronic kidney disease: Secondary | ICD-10-CM | POA: Diagnosis not present

## 2018-12-09 DIAGNOSIS — R634 Abnormal weight loss: Secondary | ICD-10-CM | POA: Diagnosis not present

## 2018-12-09 DIAGNOSIS — Z0000 Encounter for general adult medical examination without abnormal findings: Secondary | ICD-10-CM | POA: Diagnosis not present

## 2018-12-09 DIAGNOSIS — I129 Hypertensive chronic kidney disease with stage 1 through stage 4 chronic kidney disease, or unspecified chronic kidney disease: Secondary | ICD-10-CM

## 2018-12-09 DIAGNOSIS — N182 Chronic kidney disease, stage 2 (mild): Secondary | ICD-10-CM | POA: Diagnosis not present

## 2018-12-09 LAB — POCT UA - MICROALBUMIN
Albumin/Creatinine Ratio, Urine, POC: 300
Creatinine, POC: 50 mg/dL
Microalbumin Ur, POC: 150 mg/L

## 2018-12-09 LAB — POCT URINALYSIS DIPSTICK
Bilirubin, UA: NEGATIVE
Glucose, UA: NEGATIVE
Ketones, UA: NEGATIVE
Leukocytes, UA: NEGATIVE
Nitrite, UA: NEGATIVE
Protein, UA: POSITIVE — AB
Spec Grav, UA: 1.015 (ref 1.010–1.025)
Urobilinogen, UA: 0.2 E.U./dL
pH, UA: 6.5 (ref 5.0–8.0)

## 2018-12-09 NOTE — Patient Instructions (Signed)

## 2018-12-10 LAB — CBC
Hematocrit: 42.1 % (ref 34.0–46.6)
Hemoglobin: 13.7 g/dL (ref 11.1–15.9)
MCH: 28.1 pg (ref 26.6–33.0)
MCHC: 32.5 g/dL (ref 31.5–35.7)
MCV: 86 fL (ref 79–97)
Platelets: 260 10*3/uL (ref 150–450)
RBC: 4.88 x10E6/uL (ref 3.77–5.28)
RDW: 13.3 % (ref 11.7–15.4)
WBC: 6.5 10*3/uL (ref 3.4–10.8)

## 2018-12-10 LAB — CMP14+EGFR
ALT: 17 IU/L (ref 0–32)
AST: 19 IU/L (ref 0–40)
Albumin/Globulin Ratio: 1.6 (ref 1.2–2.2)
Albumin: 5 g/dL — ABNORMAL HIGH (ref 3.7–4.7)
Alkaline Phosphatase: 115 IU/L (ref 39–117)
BUN/Creatinine Ratio: 14 (ref 12–28)
BUN: 14 mg/dL (ref 8–27)
Bilirubin Total: 0.6 mg/dL (ref 0.0–1.2)
CO2: 22 mmol/L (ref 20–29)
Calcium: 10.5 mg/dL — ABNORMAL HIGH (ref 8.7–10.3)
Chloride: 102 mmol/L (ref 96–106)
Creatinine, Ser: 1.03 mg/dL — ABNORMAL HIGH (ref 0.57–1.00)
GFR calc Af Amer: 60 mL/min/{1.73_m2} (ref >59)
GFR calc non Af Amer: 52 mL/min/{1.73_m2} — ABNORMAL LOW (ref >59)
Globulin, Total: 3.1 g/dL (ref 1.5–4.5)
Glucose: 125 mg/dL — ABNORMAL HIGH (ref 65–99)
Potassium: 4.7 mmol/L (ref 3.5–5.2)
Sodium: 138 mmol/L (ref 134–144)
Total Protein: 8.1 g/dL (ref 6.0–8.5)

## 2018-12-10 LAB — HEMOGLOBIN A1C
Est. average glucose Bld gHb Est-mCnc: 169 mg/dL
Hgb A1c MFr Bld: 7.5 % — ABNORMAL HIGH (ref 4.8–5.6)

## 2018-12-10 LAB — TSH: TSH: 1.64 u[IU]/mL (ref 0.450–4.500)

## 2018-12-11 ENCOUNTER — Ambulatory Visit: Payer: Self-pay

## 2018-12-11 NOTE — Chronic Care Management (AMB) (Signed)
  Chronic Care Management   Outreach Note  12/11/2018 Name: Jean [name] MRN: [id number] DOB: Nov 30, 1940  Referred by: [name], MD Reason for referral : Care Coordination   An unsuccessful telephone outreach was attempted today. The patient was referred to the case management team by for assistance with chronic care management and care coordination.   Follow Up Plan: A HIPPA compliant phone message was left for the patient providing contact information and requesting a return call.  The CM team will reach out to the patient again over the next 7 days.   Bevelyn Ngo, BSW, CDP [facility] / [facility] Care Management Social Worker [phone]  Total time spent performing care coordination and/or care management activities with the patient by phone or face to face = 5 minutes.

## 2018-12-16 ENCOUNTER — Ambulatory Visit (INDEPENDENT_AMBULATORY_CARE_PROVIDER_SITE_OTHER): Payer: Medicare Other

## 2018-12-16 ENCOUNTER — Other Ambulatory Visit: Payer: Self-pay

## 2018-12-16 ENCOUNTER — Ambulatory Visit: Payer: Self-pay

## 2018-12-16 DIAGNOSIS — N182 Chronic kidney disease, stage 2 (mild): Secondary | ICD-10-CM

## 2018-12-16 DIAGNOSIS — E1122 Type 2 diabetes mellitus with diabetic chronic kidney disease: Secondary | ICD-10-CM

## 2018-12-16 DIAGNOSIS — I129 Hypertensive chronic kidney disease with stage 1 through stage 4 chronic kidney disease, or unspecified chronic kidney disease: Secondary | ICD-10-CM

## 2018-12-16 NOTE — Chronic Care Management (AMB) (Signed)
Chronic Care Management    Clinical Social Work General Note  12/16/2018 Name: JERONICA STLOUIS MRN: [id number] DOB: 05/16/1941  REMIE MATHISON is a 78 y.o. year old female who is a primary care patient of Glendale Chard, MD. The CCM was consulted to assist the patient with [employer].   Ms. Germani was given information about Chronic Care Management services today including:  1. CCM service includes personalized support from designated clinical staff supervised by her physician, including individualized plan of care and coordination with other care providers 2. 24/7 contact phone numbers for assistance for urgent and routine care needs. 3. Service will only be billed when office clinical staff spend 20 minutes or more in a month to coordinate care. 4. Only one practitioner may furnish and bill the service in a calendar month. 5. The patient may stop CCM services at any time (effective at the end of the month) by phone call to the office staff. 6. The patient will be responsible for cost sharing (co-pay) of up to 20% of the service fee (after annual deductible is met).  Patient agreed to services and verbal consent obtained.   Review of patient status, including review of consultants reports, relevant laboratory and other test results, and collaboration with appropriate care team members and the patient's provider was performed as part of comprehensive patient evaluation and provision of chronic care management services.    SDOH (Social Determinants of Health) screening performed today. The patient does not identify any challenges related to SDOH. The patient does report difficulty in affording medications due to being in a coverage gap. The patient would also like to learn more information surrounding advance directives. CCM SW collaborated with CCM RN Case Freight forwarder and [place] regarding patient enrollment status and patient stated goals.  Goals Addressed            This Visit's  Progress     Patient Stated   . "I am having a hard time affording my medications" (pt-stated)       Current Barriers:  . Financial constraints  . Currently in a Medicare coverage gap  . Lacks knowledge of resources such as patient assistance   Clinical Social Work Clinical Goal(s):  [name] Over the next 30 days, patient will work with [place] to address needs related to medication cost  Interventions: . Patient interviewed and appropriate assessments performed . Provided patient with information about CCM team  . Discussed plans with patient for ongoing care management follow up and provided patient with direct contact information for care management team  . Determined the patient has contacted her insurance plan who reports she is in a coverage gap which has led to an increase in medication costs . Educated the patient on patient assistance programs available pending income, spending requirements, and drug manufacturer specifications. The patient reports she has recently switched to [place]  . Advised the patient CCM SW would have pharmacist follow up to address patient stated goal . Collaboration with CCM RPh Lottie Dawson to request involvment  Patient Self Care Activities:  . Self administers medications as prescribed . Attends all scheduled provider appointments . Calls pharmacy for medication refills  Initial goal documentation     . "I am interested in learning more about advance directives" (pt-stated)       Current Barriers:  . Limited education about the importance of naming a healthcare power of attorney  Clinical Social Work Clinical Goal(s):  [name] Over the next 20 days,  the patient will review mailed EMMI education on Advance Directive . Over the next 30 days, patient will verbalize basic understanding of Advanced Directives and importance of completion  Interventions: . Interviewed patient about Financial controller and provided education about the importance of  completing advanced directives . Mailed the patient an EMMI educational handout on Advance Directives as well as an Emergency planning/management officer . Advised patient to review information mailed by this SW  Patient Self Care Activities:  . Self administers medications as prescribed . Attends all scheduled provider appointments . Performs ADL's independently  Initial goal documentation        Follow Up Plan: SW will follow up with patient by phone over the next 3-4 weeks to review advance directives       Nadara Eaton, CDP [facility] / [facility] Care Management Social Worker [phone]  Total time spent performing care coordination and/or care management activities with the patient by phone or face to face = 18 minutes.

## 2018-12-16 NOTE — Patient Instructions (Signed)
Social Worker Visit Information  Goals we discussed today:  Goals Addressed            This Visit's Progress     Patient Stated   . "I am having a hard time affording my medications" (pt-stated)       Current Barriers:  . Financial constraints  . Currently in a Medicare coverage gap  . Lacks knowledge of resources such as patient assistance   Clinical Social Work Clinical Goal(s):  [name] Over the next 30 days, patient will work with [place] to address needs related to medication cost  Interventions: . Patient interviewed and appropriate assessments performed . Provided patient with information about CCM team  . Discussed plans with patient for ongoing care management follow up and provided patient with direct contact information for care management team  . Determined the patient has contacted her insurance plan who reports she is in a coverage gap which has led to an increase in medication costs . Educated the patient on patient assistance programs available pending income, spending requirements, and drug manufacturer specifications. The patient reports she has recently switched to [place]  . Advised the patient CCM SW would have pharmacist follow up to address patient stated goal . Collaboration with CCM RPh Lottie Dawson to request involvment  Patient Self Care Activities:  . Self administers medications as prescribed . Attends all scheduled provider appointments . Calls pharmacy for medication refills  Initial goal documentation     . "I am interested in learning more about advance directives" (pt-stated)       Current Barriers:  . Limited education about the importance of naming a healthcare power of attorney  Clinical Social Work Clinical Goal(s):  [name] Over the next 20 days, the patient will review mailed EMMI education on Advance Directive . Over the next 30 days, patient will verbalize basic understanding of Advanced Directives and importance of  completion  Interventions: . Interviewed patient about Financial controller and provided education about the importance of completing advanced directives . Mailed the patient an EMMI educational handout on Advance Directives as well as an Emergency planning/management officer . Advised patient to review information mailed by this SW  Patient Self Care Activities:  . Self administers medications as prescribed . Attends all scheduled provider appointments . Performs ADL's independently  Initial goal documentation           Materials provided: Yes: Mailed information on advance directive  Ms. Rzasa was given information about Chronic Care Management services today including:  1. CCM service includes personalized support from designated clinical staff supervised by her physician, including individualized plan of care and coordination with other care providers 2. 24/7 contact phone numbers for assistance for urgent and routine care needs. 3. Service will only be billed when office clinical staff spend 20 minutes or more in a month to coordinate care. 4. Only one practitioner may furnish and bill the service in a calendar month. 5. The patient may stop CCM services at any time (effective at the end of the month) by phone call to the office staff. 6. The patient will be responsible for cost sharing (co-pay) of up to 20% of the service fee (after annual deductible is met).  Patient agreed to services and verbal consent obtained.   The patient verbalized understanding of instructions provided today and declined a print copy of patient instruction materials.   Follow up plan: SW will follow up with patient by phone over the next 3-4 weeks  Daneen Schick, BSW, CDP [facility] / [facility] Care Management Social Worker [phone]

## 2018-12-16 NOTE — Patient Instructions (Addendum)
Visit Information  Goals Addressed    . Assist with Chronic Disease Management and Care Coordination Needs        Current Barriers:  [name] Knowledge Barriers related to resources and support available to address needs related to Chronic Disease Management and Care Coordination needs   Case Manager Clinical Goal(s):  [name] Over the next 30 days, patient will work with the CCM team to address needs related to Chronic disease management and [employer].   CCM RN CM Interventions:  Completed on 12/16/18 . Collaborated with BSW and initiated plan of care to address needs related to Advanced Diectives and [employer] needs for transportation  Patient Self Care Activities:  . Attends all scheduled provider appointments . Calls pharmacy for medication refills . Calls provider office for new concerns or questions  Initial goal documentation       The patient verbalized understanding of instructions provided today and declined a print copy of patient instruction materials.   Telephone follow up appointment with CCM team member scheduled for: 12/30/18  [name], [facility] Care Management Coordinator [facility] Care Management/[facility]  Direct Phone: [phone]

## 2018-12-16 NOTE — Chronic Care Management (AMB) (Addendum)
  Chronic Care Management   Initial Visit Note  12/16/2018 Name: Jean [name] MRN: [id number] DOB: 1940/08/10  Referred by: [name], MD Reason for referral : Chronic Care Management (CCM RN Collaborative Follow Up )   Jean [name] is a 78 y.o. year old female who is a primary care patient of [name], MD. The CCM team was consulted for assistance with chronic disease management and care coordination needs.   Review of patient status, including review of consultants reports, relevant laboratory and other test results, and collaboration with appropriate care team members and the patient's provider was performed as part of comprehensive patient evaluation and provision of chronic care management services.    I initiated and established the plan of care for Jean [name] during one on one collaboration with my clinical care management colleague Bevelyn Ngo BSW who is also engaged with this patient to address social work needs.   Goals Addressed    . Assist with Chronic Disease Management and Care Coordination Needs        Current Barriers:  [name] Knowledge Barriers related to resources and support available to address needs related to Chronic Disease Management and Care Coordination needs   Case Manager Clinical Goal(s):  [name] Over the next 30 days, patient will work with the CCM team to address needs related to Chronic disease management and [employer].   CCM RN CM Interventions:  Completed on 12/16/18 . Collaborated with BSW and initiated plan of care to address needs related to Advanced Diectives and [employer] needs for transportation  Patient Self Care Activities:  . Attends all scheduled provider appointments . Calls pharmacy for medication refills . Calls provider office for new concerns or questions  Initial goal documentation        Telephone follow up appointment with CCM team member scheduled for: 12/30/18  [name], [facility] Care  Management Coordinator [facility] Care Management/[facility]  Direct Phone: [phone]

## 2018-12-16 NOTE — Patient Outreach (Signed)
[facility]) Care Management  12/16/2018  Jean [name] August 02, 1940 [id number]   Medication Adherence call to Jean [name] patient did not answer patient is past due on Rosuvastatin 20 mg under [facility] Ins.   Lillia Abed CPhT Pharmacy Technician [facility] Care Management Direct Dial [phone]  Fax [phone] [email or web address]

## 2018-12-18 ENCOUNTER — Other Ambulatory Visit: Payer: Self-pay

## 2018-12-18 MED ORDER — BENAZEPRIL HCL 20 MG PO TABS: 20.0000 mg | ORAL_TABLET | Freq: Every day | ORAL | 1 refills | Status: DC

## 2018-12-18 MED ORDER — ROSUVASTATIN CALCIUM 20 MG PO TABS: 20.0000 mg | ORAL_TABLET | Freq: Every day | ORAL | 2 refills | Status: DC

## 2018-12-18 MED ORDER — AMLODIPINE BESYLATE 10 MG PO TABS: 10.0000 mg | ORAL_TABLET | Freq: Every day | ORAL | 5 refills | Status: DC

## 2018-12-21 NOTE — Progress Notes (Signed)
Subjective:     Patient ID: Jean [name] , female    DOB: 06/07/1941 , 78 y.o.   MRN: [id number]   Chief Complaint  Patient presents with  . Annual Exam  . Diabetes  . Hypertension    HPI  She is here today for a full physical examination. She is no longer followed by GYN. She has no specific concerns or complaints at this time.   Diabetes  She presents for her follow-up diabetic visit. She has type 2 diabetes mellitus. Her disease course has been worsening. There are no hypoglycemic associated symptoms. Pertinent negatives for diabetes include no blurred vision and no chest pain. There are no hypoglycemic complications. Diabetic complications include nephropathy. Risk factors for coronary artery disease include diabetes mellitus, dyslipidemia, hypertension and post-menopausal. Current diabetic treatment includes oral agent (monotherapy). She is compliant with treatment most of the time. She is following a diabetic diet. She participates in exercise intermittently. Her home blood glucose trend is decreasing steadily.  Hypertension  This is a chronic problem. The current episode started more than 1 year ago. The problem has been gradually improving since onset. The problem is controlled. Pertinent negatives include no blurred vision, chest pain, palpitations or shortness of breath. Risk factors for coronary artery disease include diabetes mellitus and dyslipidemia. Past treatments include ACE inhibitors. The current treatment provides moderate improvement. Hypertensive end-organ damage includes kidney disease.     Past Medical History:  Diagnosis Date  . Blood transfusion without reported diagnosis    No reaction   . Diabetes mellitus without complication ([place])   . Hypertension      Family History  Problem Relation Age of Onset  . Breast cancer Sister   . Breast cancer Sister   . Healthy Mother   . Healthy Father      Current Outpatient Medications:  .  aspirin EC 81 MG  tablet, Take 81 mg by mouth daily., Disp: , Rfl:  .  cholecalciferol (VITAMIN D) 1000 UNITS tablet, Take 1,000 Units by mouth daily., Disp: , Rfl:  .  cinacalcet (SENSIPAR) 30 MG tablet, Take 30 mg by mouth daily., Disp: , Rfl:  .  diphenhydrAMINE (BENADRYL) 25 MG tablet, Take 25 mg by mouth every 6 (six) hours as needed for itching, allergies or sleep., Disp: , Rfl:  .  famotidine (PEPCID) 20 MG tablet, Take 1 tablet (20 mg total) by mouth 2 (two) times daily., Disp: 60 tablet, Rfl: 1 .  TRADJENTA 5 MG TABS tablet, TAKE ONE TABLET BY MOUTH ONCE DAILY, Disp: 90 tablet, Rfl: 5 .  amLODipine (NORVASC) 10 MG tablet, Take 1 tablet (10 mg total) by mouth daily., Disp: 90 tablet, Rfl: 5 .  benazepril (LOTENSIN) 20 MG tablet, Take 1 tablet (20 mg total) by mouth daily., Disp: 90 tablet, Rfl: 1 .  rosuvastatin (CRESTOR) 20 MG tablet, Take 1 tablet (20 mg total) by mouth daily., Disp: 90 tablet, Rfl: 2   Allergies  Allergen Reactions  . Penicillins Anaphylaxis     The patient states she uses post menopausal status for birth control. Last LMP was No LMP recorded. Patient has had a hysterectomy.. Negative for Dysmenorrhea Negative for: breast discharge, breast lump(s), breast pain and breast self exam. Associated symptoms include abnormal vaginal bleeding. Pertinent negatives include abnormal bleeding (hematology), anxiety, decreased libido, depression, difficulty falling sleep, dyspareunia, history of infertility, nocturia, sexual dysfunction, sleep disturbances, urinary incontinence, urinary urgency, vaginal discharge and vaginal itching. Diet regular.The patient states her exercise level is  intermittent.  . The patient's tobacco use is:  Social History   Tobacco Use  Smoking Status Never Smoker  Smokeless Tobacco Never Used  . She has been exposed to passive smoke. The patient's alcohol use is:  Social History   Substance and Sexual Activity  Alcohol Use No    Review of Systems   Constitutional: Positive for unexpected weight change.  HENT: Negative.   Eyes: Negative.  Negative for blurred vision.  Respiratory: Negative.  Negative for shortness of breath.   Cardiovascular: Negative.  Negative for chest pain and palpitations.  Gastrointestinal: Negative.   Endocrine: Negative.   Genitourinary: Negative.   Musculoskeletal: Negative.   Skin: Negative.   Allergic/Immunologic: Negative.   Neurological: Negative.   Hematological: Negative.   Psychiatric/Behavioral: Negative.      Today's Vitals   12/09/18 1028  BP: (!) 146/82  Pulse: 99  Temp: 98.1 F (36.7 C)  TempSrc: Oral  Weight: 153 lb (69.4 kg)  Height: 5' 6.5" (1.689 m)  PainSc: 0-No pain   Body mass index is 24.32 kg/m.   Objective:  Physical Exam Vitals signs and nursing note reviewed.  Constitutional:      Appearance: Normal appearance.  HENT:     Head: Normocephalic and atraumatic.     Right Ear: Tympanic membrane, ear canal and external ear normal.     Left Ear: Tympanic membrane, ear canal and external ear normal.     Nose: Nose normal.     Mouth/Throat:     Mouth: Mucous membranes are moist.     Pharynx: Oropharynx is clear.  Eyes:     Extraocular Movements: Extraocular movements intact.     Conjunctiva/sclera: Conjunctivae normal.     Pupils: Pupils are equal, round, and reactive to light.  Neck:     Musculoskeletal: Normal range of motion and neck supple.  Cardiovascular:     Rate and Rhythm: Normal rate and regular rhythm.     Pulses: Normal pulses.          Dorsalis pedis pulses are 2+ on the right side and 2+ on the left side.       Posterior tibial pulses are 2+ on the right side and 2+ on the left side.     Heart sounds: Normal heart sounds.  Pulmonary:     Effort: Pulmonary effort is normal.     Breath sounds: Normal breath sounds.  Chest:     Breasts:        Right: Normal. No swelling, bleeding, inverted nipple, mass, nipple discharge or skin change.        Left:  Normal. No swelling, bleeding, inverted nipple, mass, nipple discharge or skin change.  Abdominal:     General: Abdomen is flat. Bowel sounds are normal.     Palpations: Abdomen is soft.  Genitourinary:    Comments: deferred Musculoskeletal: Normal range of motion.     Right foot: Normal range of motion. No deformity.     Left foot: Normal range of motion. No deformity.  Feet:     Right foot:     Protective Sensation: 5 sites tested. 5 sites sensed.     Toenail Condition: Right toenails are normal.     Left foot:     Protective Sensation: 5 sites tested. 5 sites sensed.     Toenail Condition: Left toenails are normal.  Skin:    General: Skin is warm and dry.  Neurological:     General: No focal deficit present.  Mental Status: She is alert and oriented to person, place, and time.  Psychiatric:        Mood and Affect: Mood normal.        Behavior: Behavior normal.         Assessment And Plan:     1. Routine general medical examination at health care facility  A full exam was performed. Importance of monthly self breast exams was discussed with the patient. PATIENT HAS BEEN ADVISED TO GET 30-45 MINUTES REGULAR EXERCISE NO LESS THAN FOUR TO FIVE DAYS PER WEEK - BOTH WEIGHTBEARING EXERCISES AND AEROBIC ARE RECOMMENDED.  SHE IS WAS ADVISED TO FOLLOW A HEALTHY DIET WITH AT LEAST SIX FRUITS/VEGGIES PER DAY, DECREASE INTAKE OF RED MEAT, AND TO INCREASE FISH INTAKE TO TWO DAYS PER WEEK.  MEATS/FISH SHOULD NOT BE FRIED, BAKED OR BROILED IS PREFERABLE.  I SUGGEST WEARING SPF 50 SUNSCREEN ON EXPOSED PARTS AND ESPECIALLY WHEN IN THE DIRECT SUNLIGHT FOR AN EXTENDED PERIOD OF TIME.  PLEASE AVOID FAST FOOD RESTAURANTS AND INCREASE YOUR WATER INTAKE.   2. Type 2 diabetes mellitus with stage 2 chronic kidney disease, without long-term current use of insulin ([place])  Diabetic foot exam was performed.  Most recent hba1c value reviewed. Pt advised goal is less than 7.5.  She does agree to [facility]  referral.  I DISCUSSED WITH THE PATIENT AT LENGTH REGARDING THE GOALS OF GLYCEMIC CONTROL AND POSSIBLE LONG-TERM COMPLICATIONS.  I  ALSO STRESSED THE IMPORTANCE OF COMPLIANCE WITH HOME GLUCOSE MONITORING, DIETARY RESTRICTIONS INCLUDING AVOIDANCE OF SUGARY DRINKS/PROCESSED FOODS,  ALONG WITH REGULAR EXERCISE.  I  ALSO STRESSED THE IMPORTANCE OF ANNUAL EYE EXAMS, SELF FOOT CARE AND COMPLIANCE WITH OFFICE VISITS.  - CMP14+EGFR - CBC - Hemoglobin A1c - Referral to Chronic Care Management Services - POCT Urinalysis Dipstick (81002) - POCT UA - Microalbumin  3. Hypertensive nephropathy  Fair control. She will continue with current meds for now. She is encouraged to avoid adding salt to her foods. EKG performed, no new changes noted.   - EKG 12-Lead  4. Weight loss  I have reviewed her weights for the past several years, they do fluctuate. Her lowest weight was 148 pounds. I will check thyroid function. She agrees to rto in 3 months for re-evaluation. She is up to date with health screenings.   - TSH        [name], MD    THE PATIENT IS ENCOURAGED TO PRACTICE SOCIAL DISTANCING DUE TO THE COVID-19 PANDEMIC.

## 2018-12-22 ENCOUNTER — Other Ambulatory Visit: Payer: Self-pay

## 2018-12-22 MED ORDER — AMLODIPINE BESYLATE 10 MG PO TABS: 10.0000 mg | ORAL_TABLET | Freq: Every day | ORAL | 1 refills | Status: DC

## 2018-12-22 MED ORDER — ROSUVASTATIN CALCIUM 20 MG PO TABS: 20.0000 mg | ORAL_TABLET | Freq: Every day | ORAL | 1 refills | Status: DC

## 2018-12-23 ENCOUNTER — Other Ambulatory Visit: Payer: Self-pay | Admitting: Pharmacy Technician

## 2018-12-23 ENCOUNTER — Ambulatory Visit: Payer: Self-pay | Admitting: Pharmacist

## 2018-12-23 DIAGNOSIS — E1122 Type 2 diabetes mellitus with diabetic chronic kidney disease: Secondary | ICD-10-CM

## 2018-12-23 DIAGNOSIS — I129 Hypertensive chronic kidney disease with stage 1 through stage 4 chronic kidney disease, or unspecified chronic kidney disease: Secondary | ICD-10-CM

## 2018-12-23 DIAGNOSIS — N182 Chronic kidney disease, stage 2 (mild): Secondary | ICD-10-CM

## 2018-12-23 NOTE — Progress Notes (Addendum)
Chronic Care Management   Initial Visit Note  12/23/2018 Name: Jean [name] MRN: [id number] DOB: [birth year]  Referred by: [name], MD Reason for referral : Chronic Care Management   Jean [name] is a 78 y.o. year old female who is a primary care patient of [name], MD. The CCM team was consulted for assistance with chronic disease management and care coordination needs.   Review of patient status, including review of consultants reports, relevant laboratory and other test results, and collaboration with appropriate care team members and the patient's provider was performed as part of comprehensive patient evaluation and provision of chronic care management services.    I spoke with Ms Blan by telephone today.  Objective:   Goals Addressed            This Visit's Progress     Patient Stated   . "I am having a hard time affording my medications" (pt-stated)       Current Barriers:  . Financial constraints  . Currently in a Medicare coverage gap  . Lacks knowledge of resources such as patient assistance   Clinical Social Work Clinical Goal(s):  [name] Over the next 30 days, patient will work with CCM RPh to address needs related to medication cost  Interventions: . Patient interviewed and appropriate assessments performed . Provided patient with information about CCM team  . Discussed plans with patient for ongoing care management follow up and provided patient with direct contact information for care management team  . Determined the patient has contacted her insurance plan who reports she is in a coverage gap which has led to an increase in medication costs  . Patient agreeable to participate in patient assistance program with [employer] for [employer] for [employer]. Educated the patient on patient assistance programs available pending income, spending requirements, and drug manufacturer specifications.  . Collaboration with PCP and [facility] CPhT,  Lilla Shook regarding application process  Patient Self Care Activities:  . Self administers medications as prescribed . Attends all scheduled provider appointments . Calls pharmacy for medication refills  Please see past updates related to this goal by clicking on the "Past Updates" button in the selected goal      . I want to work on controling my diabetes (pt-stated)       Current Barriers:  . Non Adherence to prescribed medication regimen  Pharmacist Clinical Goal(s):  [name] Over the next 30 days, patient will demonstrate Improved medication adherence as evidenced by controlled blood sugars & improved quality of life . Over the next 30 days, patient will work with CCM team  to address needs related to diabetes education/lifestyle  Interventions: . Comprehensive medication review performed. . Advised patient to continue eating a diabetic-healthy diet and exercise as able.  Patient's A1c has decreased from 9.1 to 7.5 on 12/09/18.  She states she is continuing to eat better and make healthier decisions.  She is encouraged by the improved labs and will continue to adhere to regimen & lifestyle. . Patient denies adverse events from medications and is tolerating current regimen without concerns. . She reports 100% compliance with medication regimen  Patient Self Care Activities:  . Self administers medications as prescribed . Attends all scheduled provider appointments . Calls pharmacy for medication refills  Initial goal documentation      Plan:   The CM team will reach out to the patient again over the next 14 days.   Kieth Brightly, PharmD, BCPS Clinical Pharmacist,  [facility] [place]  [facility]  Direct Dial: [phone]

## 2018-12-23 NOTE — Patient Instructions (Signed)
Visit Information  Goals Addressed            This Visit's Progress     Patient Stated   . "I am having a hard time affording my medications" (pt-stated)       Current Barriers:  . Financial constraints  . Currently in a Medicare coverage gap  . Lacks knowledge of resources such as patient assistance   Clinical Social Work Clinical Goal(s):  [name] Over the next 30 days, patient will work with CCM RPh to address needs related to medication cost  Interventions: . Patient interviewed and appropriate assessments performed . Provided patient with information about CCM team  . Discussed plans with patient for ongoing care management follow up and provided patient with direct contact information for care management team  . Determined the patient has contacted her insurance plan who reports she is in a coverage gap which has led to an increase in medication costs  . Patient agreeable to participate in patient assistance program with [employer] for [employer]. Educated the patient on patient assistance programs available pending income, spending requirements, and drug manufacturer specifications.  . Collaboration with PCP and [facility] CPhT, Lilla Shook regarding application process  Patient Self Care Activities:  . Self administers medications as prescribed . Attends all scheduled provider appointments . Calls pharmacy for medication refills  Please see past updates related to this goal by clicking on the "Past Updates" button in the selected goal      . I want to work on controling my diabetes (pt-stated)       Current Barriers:  . Non Adherence to prescribed medication regimen  Pharmacist Clinical Goal(s):  [name] Over the next 30 days, patient will demonstrate Improved medication adherence as evidenced by controlled blood sugars & improved quality of life . Over the next 30 days, patient will work with CCM team  to address needs related to diabetes  education/lifestyle  Interventions: . Comprehensive medication review performed. . Advised patient to continue eating a diabetic-healthy diet and exercise as able.  Patient's A1c has decreased from 9.1 to 7.5 on 12/09/18.  She states she is continuing to eat better and make healthier decisions.  She is encouraged by the improved labs and will continue to adhere to regimen & lifestyle. . Patient denies adverse events from medications and is tolerating current regimen without concerns. . She reports 100% compliance with medication regimen  Patient Self Care Activities:  . Self administers medications as prescribed . Attends all scheduled provider appointments . Calls pharmacy for medication refills  Initial goal documentation         The patient verbalized understanding of instructions provided today and declined a print copy of patient instruction materials.   The CM team will reach out to the patient again over the next 14 days.   Kieth Brightly, PharmD, BCPS Clinical Pharmacist, [facility]  [facility]  Direct Dial: [phone]

## 2018-12-23 NOTE — Patient Outreach (Addendum)
[facility]) Care Management  12/23/2018  CHEQUITA WARLEY 05/13/1941 [id number]                          Medication Assistance Referral  Referral From: [facility] R[facility] Kandra Nicolas [facility])  Medication/Company: [name] / [employer] Patient application portion:  Mailed Provider application portion: Faxed  to Dr. Allyne Gee  Medication/Company: Ala Bent / [employer] Patient application portion:  Mailed Provider application portion: Faxed  To Dr. Zetta Bills  Follow up:  Will follow up with patient in 7-10 business days to confirm application(s) have been received.  [name] Effie Shy CPhT Certified Pharmacy Technician [facility] Care Management Direct Dial:[phone]

## 2018-12-30 ENCOUNTER — Ambulatory Visit: Payer: Self-pay

## 2018-12-30 ENCOUNTER — Telehealth: Payer: Self-pay

## 2018-12-30 ENCOUNTER — Other Ambulatory Visit: Payer: Self-pay

## 2018-12-30 DIAGNOSIS — N182 Chronic kidney disease, stage 2 (mild): Secondary | ICD-10-CM

## 2018-12-30 DIAGNOSIS — I129 Hypertensive chronic kidney disease with stage 1 through stage 4 chronic kidney disease, or unspecified chronic kidney disease: Secondary | ICD-10-CM

## 2018-12-30 DIAGNOSIS — E1122 Type 2 diabetes mellitus with diabetic chronic kidney disease: Secondary | ICD-10-CM

## 2018-12-31 NOTE — Patient Instructions (Signed)
Visit Information  Goals Addressed            This Visit's Progress     Patient Stated   . "I would like to learn more about Meal planning" (pt-stated)       Current Barriers:  [name] Knowledge Deficits related to diabetes disease process and Meal Planning  Nurse Case Manager Clinical Goal(s):  [name] Over the next 30 days, patient will work with CCM RNCM to address needs related to diabetes disease process and treatment management with Meal Planning   CCM RN CM Interventions:  Completed call with patient on 12/30/18:   . Evaluation of current treatment plan related to diabetes and patient's adherence to plan as established by provider. . Provided education to patient re: target A1C; discussed current A!C of 7.5 . Discussed plans with patient for ongoing care management follow up and provided patient with direct contact information for care management team . Provided patient with printed  educational materials related to diabetes disease management and Meal Planning; Know Your A1C; s/s of Hypo/Hyperglycemia . Advised patient, providing education and rationale, to check cbg daily  and record, calling RNCM and or PCP provider for findings outside established parameters.    Patient Self Care Activities:  . Self administers medications as prescribed . Attends all scheduled provider appointments . Calls pharmacy for medication refills . Performs ADL's independently . Performs IADL's independently . Calls provider office for new concerns or questions  Initial goal documentation        The patient verbalized understanding of instructions provided today and declined a print copy of patient instruction materials.   The care management team will reach out to the patient again over the next 2-3 weeks.   [name], RN,CCM Care Management Coordinator [facility] Care Management/[facility]  Direct Phone: [phone]

## 2018-12-31 NOTE — Chronic Care Management (AMB) (Signed)
  Chronic Care Management   Initial Visit Note  12/30/2018 Name: Jean [name] MRN: [id number] DOB: 09-18-1940  Referred by: [name], MD Reason for referral : Chronic Care Management (INITIAL CCM RNCM Telephone Follow Up)   DANESHIA LAPORTE is a 78 y.o. year old female who is a primary care patient of [name], MD. The CCM team was consulted for assistance with chronic disease management and care coordination needs.   Review of patient status, including review of consultants reports, relevant laboratory and other test results, and collaboration with appropriate care team members and the patient's provider was performed as part of comprehensive patient evaluation and provision of chronic care management services.    I spoke with [name] by telephone today to assess for CCM needs and establish a plan of care.   Objective:  Lab Results  Component Value Date   HGBA1C 7.5 (H) 12/09/2018   HGBA1C 9.1 (H) 08/24/2018   HGBA1C (H) 10/22/2007    7.1 (NOTE)   The ADA recommends the following therapeutic goals for glycemic   control related to Hgb A1C measurement:   Goal of Therapy:   < 7.0% Hgb A1C   Action Suggested:  > 8.0% Hgb A1C   Ref:  Diabetes Care, 22, Suppl. 1, 1999   Lab Results  Component Value Date   MICROALBUR 150 12/09/2018   LDLCALC 112 (H) 08/24/2018   CREATININE 1.03 (H) 12/09/2018   BP Readings from Last 3 Encounters:  12/09/18 (!) 146/82  08/24/18 116/72  09/21/12 118/64    Goals Addressed      Patient Stated   . "I would like to learn more about Meal planning" (pt-stated)       Current Barriers:  [name] Knowledge Deficits related to diabetes disease process and Meal Planning  Nurse Case Manager Clinical Goal(s):  [name] Over the next 30 days, patient will work with CCM RNCM to address needs related to diabetes disease process and treatment management with Meal Planning   CCM RN CM Interventions:  Completed Call with patient on 12/30/18:   . Evaluation of  current treatment plan related to diabetes and patient's adherence to plan as established by provider. . Provided education to patient re: target A1C; discussed current A!C of 7.5 . Discussed plans with patient for ongoing care management follow up and provided patient with direct contact information for care management team . Provided patient with printed  educational materials related to diabetes disease management and Meal Planning; Know Your A1C; s/s of Hypo/Hyperglycemia . Advised patient, providing education and rationale, to check cbg daily  and record, calling RNCM and or PCP provider for findings outside established parameters.    Patient Self Care Activities:  . Self administers medications as prescribed . Attends all scheduled provider appointments . Calls pharmacy for medication refills . Performs ADL's independently . Performs IADL's independently . Calls provider office for new concerns or questions  Initial goal documentation       The care management team will reach out to the patient again over the next 2-3 weeks.   [name], RN,CCM Care Management Coordinator [facility] Care Management/[facility]  Direct Phone: [phone]

## 2019-01-04 ENCOUNTER — Ambulatory Visit (INDEPENDENT_AMBULATORY_CARE_PROVIDER_SITE_OTHER): Payer: Medicare Other | Admitting: Pharmacist

## 2019-01-04 DIAGNOSIS — E1122 Type 2 diabetes mellitus with diabetic chronic kidney disease: Secondary | ICD-10-CM

## 2019-01-04 DIAGNOSIS — E7800 Pure hypercholesterolemia, unspecified: Secondary | ICD-10-CM | POA: Diagnosis not present

## 2019-01-04 DIAGNOSIS — I129 Hypertensive chronic kidney disease with stage 1 through stage 4 chronic kidney disease, or unspecified chronic kidney disease: Secondary | ICD-10-CM | POA: Diagnosis not present

## 2019-01-04 DIAGNOSIS — N182 Chronic kidney disease, stage 2 (mild): Secondary | ICD-10-CM | POA: Diagnosis not present

## 2019-01-04 NOTE — Patient Instructions (Signed)
Visit Information  Goals Addressed            This Visit's Progress     Patient Stated   . "I am having a hard time affording my medications" (pt-stated)       Current Barriers:  . Financial constraints  . Currently in a Medicare coverage gap  . Lacks knowledge of resources such as patient assistance   Clinical Social Work Clinical Goal(s):  [name] Over the next 30 days, patient will work with Keith to address needs related to medication cost  Interventions: . Patient interviewed and appropriate assessments performed . Provided patient with information about CCM team  . Discussed plans with patient for ongoing care management follow up and provided patient with direct contact information for care management team  . Determined the patient has contacted her insurance plan who reports she is in a coverage gap which has led to an increase in medication costs  . Patient agreeable to participate in patient assistance program with [employer] for [place]. Educated the patient on patient assistance programs available pending income, spending requirements, and drug manufacturer specifications.  [name] Collaboration with PCP and [facility] CPhT, Etter Sjogren regarding application process-->patient has mailed back applications.  Anticipate 3 more weeks until medication approved and delivered ([place]).  Provider portion received.  Patient Self Care Activities:  . Self administers medications as prescribed . Attends all scheduled provider appointments . Calls pharmacy for medication refills  Please see past updates related to this goal by clicking on the "Past Updates" button in the selected goal      . I want to work on controling my diabetes (pt-stated)       Current Barriers:  . Non Adherence to prescribed medication regimen  Pharmacist Clinical Goal(s):  [name] Over the next 30 days, patient will demonstrate Improved medication adherence as evidenced by controlled blood sugars &  improved quality of life . Over the next 60 days, patient will work with PharmD and PCP to address needs related to optimized medication management of chronic conditions  Interventions: . Comprehensive medication review performed. No changes to current regimen.  Patient tolerating well.  She denies adverse events. . Encouraged patient to continue eating a diabetic-healthy diet and exercise as able.  Patient reports her FBG was 121 this morning!  She states her FBG are mostly<130 and she denies hypoglycemia.   . Patient's A1c has decreased from 9.1 to 7.5 on 12/09/18.  She states she is continuing to eat better and make healthier decisions.  She is encouraged by the improved labs and will continue to adhere to regimen & lifestyle. . Patient denies adverse events from medications and is tolerating current regimen without concerns. . She reports 100% compliance with medication regimen  Patient Self Care Activities:  . Self administers medications as prescribed . Attends all scheduled provider appointments . Calls pharmacy for medication refills  Please see past updates related to this goal by clicking on the "Past Updates" button in the selected goal          The patient verbalized understanding of instructions provided today and declined a print copy of patient instruction materials.   The care management team will reach out to the patient again over the next 14 days.   Regina Eck, PharmD, BCPS Clinical Pharmacist, [facility] [place]: [phone]

## 2019-01-04 NOTE — Progress Notes (Signed)
Chronic Care Management   Visit Note  01/04/2019 Name: Jean [name] MRN: [id number] DOB: 11/01/1940  Referred by: Dorothyann PengSanders, Robyn, MD Reason for referral : Chronic Care Management   Jean [name] is a 78 y.o. year old female who is a primary care patient of Dorothyann PengSanders, Robyn, MD. The CCM team was consulted for assistance with chronic disease management and care coordination needs.   Review of patient status, including review of consultants reports, relevant laboratory and other test results, and collaboration with appropriate care team members and the patient's provider was performed as part of comprehensive patient evaluation and provision of chronic care management services.    I spoke with Jean [name] by telephone today.  Objective:   Goals Addressed            This Visit's Progress     Patient Stated   . "I am having a hard time affording my medications" (pt-stated)       Current Barriers:  . Financial constraints  . Currently in a Medicare coverage gap  . Lacks knowledge of resources such as patient assistance   Clinical Social Work Clinical Goal(s):  [name]. Over the next 30 days, patient will work with CCM RPh to address needs related to medication cost  Interventions: . Patient interviewed and appropriate assessments performed . Provided patient with information about CCM team  . Discussed plans with patient for ongoing care management follow up and provided patient with direct contact information for care management team  . Determined the patient has contacted her insurance plan who reports she is in a coverage gap which has led to an increase in medication costs  . Patient agreeable to participate in patient assistance program with [employer] for Tradjenta. Educated the patient on patient assistance programs available pending income, spending requirements, and drug manufacturer specifications.  [name]. Collaboration with PCP and [facility] CPhT, Lilla ShookAshley Coleman regarding  application process-->patient has mailed back applications.  Anticipate 3 more weeks until medication approved and delivered (Tradjenta & Sensipar).  Provider portion received.  Patient Self Care Activities:  . Self administers medications as prescribed . Attends all scheduled provider appointments . Calls pharmacy for medication refills  Please see past updates related to this goal by clicking on the "Past Updates" button in the selected goal      . I want to work on controling my diabetes (pt-stated)       Current Barriers:  . Non Adherence to prescribed medication regimen  Pharmacist Clinical Goal(s):  [name]. Over the next 30 days, patient will demonstrate Improved medication adherence as evidenced by controlled blood sugars & improved quality of life . Over the next 60 days, patient will work with PharmD and PCP to address needs related to optimized medication management of chronic conditions  Interventions: . Comprehensive medication review performed. No changes to current regimen.  Patient tolerating well.  She denies adverse events. . Encouraged patient to continue eating a diabetic-healthy diet and exercise as able.  Patient reports her FBG was 121 this morning!  She states her FBG are mostly<130 and she denies hypoglycemia.   . Patient's A1c has decreased from 9.1 to 7.5 on 12/09/18.  She states she is continuing to eat better and make healthier decisions.  She is encouraged by the improved labs and will continue to adhere to regimen & lifestyle. . Patient denies adverse events from medications and is tolerating current regimen without concerns. . She reports 100% compliance with medication regimen  Patient Self Care  Activities:  . Self administers medications as prescribed . Attends all scheduled provider appointments . Calls pharmacy for medication refills  Please see past updates related to this goal by clicking on the "Past Updates" button in the selected goal           Plan:   The care management team will reach out to the patient again over the next 14 days.   Regina Eck, PharmD, BCPS Clinical Pharmacist, [facility] [place]: [phone]

## 2019-01-06 ENCOUNTER — Other Ambulatory Visit: Payer: Self-pay | Admitting: Pharmacy Technician

## 2019-01-06 NOTE — Patient Outreach (Signed)
[facility]) Care Management  01/06/2019  Jean [name] June 01, 1941 [id number]   Received patient portion(s) of patient assistance application for Tradjenta and [name]. Faxed completed application and required documents into [employer].  Re-faxed provider portion of Sensipar application to Dr. Posey Pronto, will submit to [employer] once received.  Will follow up with [employer]  in 7-10 business days to check status of  Tradjenta application.  [name] Certified Pharmacy Technician [place] Management Direct Dial:[phone]

## 2019-01-07 ENCOUNTER — Telehealth: Payer: Self-pay

## 2019-01-07 ENCOUNTER — Ambulatory Visit: Payer: Self-pay

## 2019-01-07 DIAGNOSIS — E1122 Type 2 diabetes mellitus with diabetic chronic kidney disease: Secondary | ICD-10-CM

## 2019-01-07 DIAGNOSIS — N182 Chronic kidney disease, stage 2 (mild): Secondary | ICD-10-CM

## 2019-01-07 NOTE — Chronic Care Management (AMB) (Signed)
  Chronic Care Management   Outreach Note  01/07/2019 Name: Jean [name] MRN: [id number] DOB: 1940-11-28  Referred by: Glendale Chard, MD Reason for referral : Care Coordination   An unsuccessful telephone outreach was attempted today to assist with completion of previous mailed advance directive. The patient was referred to the case management team by for assistance with chronic care management and care coordination.   Follow Up Plan: A HIPPA compliant phone message was left for the patient providing contact information and requesting a return call.  The care management team will reach out to the patient again over the next 10-14 days.   Daneen Schick, BSW, CDP Social Worker, Certified Dementia Practitioner [place] / [place] Management [phone]  Total time spent performing care coordination and/or care management activities with the patient by phone or face to face = 3 minutes.

## 2019-01-08 ENCOUNTER — Other Ambulatory Visit: Payer: Self-pay | Admitting: Pharmacy Technician

## 2019-01-08 NOTE — Patient Outreach (Signed)
[facility]) Care Management  01/08/2019  Jean [name] January 29, 1941 [id number]   Received provider portion(s) of patient assistance application for Sensipar. Faxed completed application and required documents into [employer].  Will follow up with company in 7-10 business days to check status of application.  Maud Deed Chana Bode [place] Certified Pharmacy Technician [place] Management Direct Dial:[phone]

## 2019-01-13 ENCOUNTER — Telehealth: Payer: Self-pay

## 2019-01-14 ENCOUNTER — Ambulatory Visit: Payer: Self-pay

## 2019-01-14 DIAGNOSIS — E1122 Type 2 diabetes mellitus with diabetic chronic kidney disease: Secondary | ICD-10-CM

## 2019-01-14 DIAGNOSIS — N182 Chronic kidney disease, stage 2 (mild): Secondary | ICD-10-CM

## 2019-01-14 DIAGNOSIS — I129 Hypertensive chronic kidney disease with stage 1 through stage 4 chronic kidney disease, or unspecified chronic kidney disease: Secondary | ICD-10-CM

## 2019-01-15 NOTE — Chronic Care Management (AMB) (Signed)
  Chronic Care Management     Social Work Follow Up Note  01/14/2019 Name: GWENETTE WELLONS MRN: [id number] DOB: Feb 10, 1941  WENDELYN KIESLING is a 78 y.o. year old female who is a primary care patient of Glendale Chard, MD. The CCM team was consulted for assistance with Advanced Directive Education.   Review of patient status, including review of consultants reports, other relevant assessments, and collaboration with appropriate care team members and the patient's provider was performed as part of comprehensive patient evaluation and provision of chronic care management services.     Goals Addressed            This Visit's Progress     Patient Stated   . COMPLETED: "I am interested in learning more about advance directives" (pt-stated)       Current Barriers:  . Limited education about the importance of naming a healthcare power of attorney  Clinical Social Work Clinical Goal(s):  [name] Over the next 20 days, the patient will review mailed EMMI education on Advance Directive . Over the next 30 days, patient will verbalize basic understanding of Advanced Directives and importance of completion  Interventions: . Outbound call to the patient to assess progression of patient stated goal . Determined the patient has received the documents and reviewed but has yet to complete them at this time "my daughter is who I would have make decisions but she gets sensitive talking about stuff like this" . Reviewed the document with the patient and explained that it is okay to complete the healthcare agent portion while choosing to opt out of completing the living will form.  . Advised the patient that if she is only interested in naming her daughter as healthcare agent without selecting whether or not she would want life prolonging measures she may initial the first statement at the top of section B and draw a line through the document . Educated the patient that this should only be done if the patient trusts  her daughter to make all decisions acting on behalf of the patients best interest the patient stated "she knows she is the person. She knows I do not want machines" . Advised the patient that once the document is completed and notarized to keep the original while providing a copy to her daughter as well as to her provider's office . Encouraged the patient to contact CCM SW if assistance is needed at a later date  Patient Self Care Activities:  . Self administers medications as prescribed . Attends all scheduled provider appointments . Performs ADL's independently  Please see past updates related to this goal by clicking on the "Past Updates" button in the selected goal            Follow Up Plan: No further follow up needed by CCM SW. The patient will continue to work with CCM RN Case Freight forwarder.   Daneen Schick, BSW, CDP Social Worker, Certified Dementia Practitioner [place] / [place] Management [phone]  Total time spent performing care coordination and/or care management activities with the patient by phone or face to face = 14 minutes.

## 2019-01-15 NOTE — Patient Instructions (Signed)
Social Worker Visit Information  Goals we discussed today:  Goals Addressed            This Visit's Progress     Patient Stated   . COMPLETED: "I am interested in learning more about advance directives" (pt-stated)       Current Barriers:  . Limited education about the importance of naming a healthcare power of attorney  Clinical Social Work Clinical Goal(s):  [name] Over the next 20 days, the patient will review mailed EMMI education on Advance Directive . Over the next 30 days, patient will verbalize basic understanding of Advanced Directives and importance of completion  Interventions: . Outbound call to the patient to assess progression of patient stated goal . Determined the patient has received the documents and reviewed but has yet to complete them at this time "my daughter is who I would have make decisions but she gets sensitive talking about stuff like this" . Reviewed the document with the patient and explained that it is okay to complete the healthcare agent portion while choosing to opt out of completing the living will form.  . Advised the patient that if she is only interested in naming her daughter as healthcare agent without selecting whether or not she would want life prolonging measures she may initial the first statement at the top of section B and draw a line through the document . Educated the patient that this should only be done if the patient trusts her daughter to make all decisions acting on behalf of the patients best interest the patient stated "she knows she is the person. She knows I do not want machines" . Advised the patient that once the document is completed and notarized to keep the original while providing a copy to her daughter as well as to her provider's office . Encouraged the patient to contact CCM SW if assistance is needed at a later date  Patient Self Care Activities:  . Self administers medications as prescribed . Attends all scheduled provider  appointments . Performs ADL's independently  Please see past updates related to this goal by clicking on the "Past Updates" button in the selected goal           Follow Up Plan: Please call me if you need further assistance.   Daneen Schick, BSW, CDP Social Worker, Certified Dementia Practitioner [place] / [place] Management [phone]

## 2019-01-25 ENCOUNTER — Telehealth: Payer: Self-pay

## 2019-01-25 ENCOUNTER — Other Ambulatory Visit: Payer: Self-pay | Admitting: Pharmacy Technician

## 2019-01-25 NOTE — Patient Outreach (Signed)
[facility]) Care Management  01/25/2019  Jean [name] [birth year] [id number]    Follow up call placed to [employer] regarding patient assistance application(s) for [place] [place] confirms patient has been approved as of 6/15 until 07/29/19. Medication to arrive at patients home in the next 5 business days.  Follow up call placed to [place] regarding patient assistance application(s) for Sensipar , Representative states that patient has been denied for program due to her insurance offering converage for [employer].  Follow up:  Will route note to [place] for [employer].  Maud Deed Chana Bode [place] Certified Pharmacy Technician [place] Management Direct Dial:[phone]

## 2019-01-26 ENCOUNTER — Ambulatory Visit: Payer: Self-pay | Admitting: Pharmacist

## 2019-01-26 DIAGNOSIS — I129 Hypertensive chronic kidney disease with stage 1 through stage 4 chronic kidney disease, or unspecified chronic kidney disease: Secondary | ICD-10-CM

## 2019-01-26 DIAGNOSIS — E1122 Type 2 diabetes mellitus with diabetic chronic kidney disease: Secondary | ICD-10-CM

## 2019-01-26 DIAGNOSIS — N182 Chronic kidney disease, stage 2 (mild): Secondary | ICD-10-CM

## 2019-01-26 DIAGNOSIS — E7800 Pure hypercholesterolemia, unspecified: Secondary | ICD-10-CM

## 2019-01-27 NOTE — Patient Instructions (Signed)
Visit Information  Goals Addressed            This Visit's Progress     Patient Stated   . "I am having a hard time affording my medications" (pt-stated)       Current Barriers:  . Financial constraints  . Currently in a Medicare coverage gap  . Lacks knowledge of resources such as patient assistance   Clinical Social Work Clinical Goal(s):  [name] Over the next 30 days, patient will work with [place] to address needs related to medication cost  Interventions: . Patient interviewed and appropriate assessments performed . Provided patient with information about CCM team  . Discussed plans with patient for ongoing care management follow up and provided patient with direct contact information for care management team  . Determined the patient has contacted her insurance plan who reports she is in a coverage gap which has led to an increase in medication costs  . Patient agreeable to participate in patient assistance program with [employer] for [place]. Educated the patient on patient assistance programs available pending income, spending requirements, and drug manufacturer specifications.  . Collaboration with PCP and [facility] CPhT, Etter Sjogren regarding application process . Patient has been approved for Tradjenta patient assistance.  Informed patient that delivery of Tradjenta should be any day now.  She was denied for Sensipar patient assistance due to insurance offering copay.  Patient still pays over $200 for 30-day supply for the generic.  It is a tier 4 generic.  Requested that Dr. Posey Pronto (nephrologist) place a tier exception request.  . Will complete this goal once patient has medication delivered.  Patient Self Care Activities:  . Self administers medications as prescribed . Attends all scheduled provider appointments . Calls pharmacy for medication refills  Please see past updates related to this goal by clicking on the "Past Updates" button in the selected goal      .  I want to work on controling my diabetes (pt-stated)       Current Barriers:  . Non Adherence to prescribed medication regimen  Pharmacist Clinical Goal(s):  [name] Over the next 30 days, patient will demonstrate Improved medication adherence as evidenced by controlled blood sugars & improved quality of life . Over the next 60 days, patient will work with PharmD and PCP to address needs related to optimized medication management of chronic conditions  Interventions: . Comprehensive medication review performed. No changes to current regimen.  Patient tolerating well.  She denies adverse events. . Encouraged patient to continue eating a diabetic-healthy diet and exercise as able.  Patient reports her FBG was 121 this morning!  She states her FBG are mostly<130 and she denies hypoglycemia.   . Patient's A1c has decreased from 9.1 to 7.5 on 12/09/18.  She states she is continuing to eat better and make healthier decisions.  She is encouraged by the improved labs and will continue to adhere to regimen & lifestyle. . Patient denies adverse events from medications and is tolerating current regimen without concerns. . She reports 100% compliance with medication regimen  Patient Self Care Activities:  . Self administers medications as prescribed . Attends all scheduled provider appointments . Calls pharmacy for medication refills  Please see past updates related to this goal by clicking on the "Past Updates" button in the selected goal          The patient verbalized understanding of instructions provided today and declined a print copy of patient instruction materials.  The care management team will reach out to the patient again over the next 3 weeks.  Kieth BrightlyJulie Dattero Vinayak Bobier, PharmD, BCPS Clinical Pharmacist, [facility]  [facility]  Direct Dial: [phone]

## 2019-01-27 NOTE — Progress Notes (Signed)
Chronic Care Management   Visit Note  01/26/2019 Name: Jean SaxFrankie G [name] MRN: [id number] DOB: 12/09/1940  Referred by: Dorothyann PengSanders, Robyn, MD Reason for referral : Chronic Care Management   Jean [name] is a [age] y.o. year old female who is a primary care patient of Dorothyann PengSanders, Robyn, MD. The CCM team was consulted for assistance with chronic disease management and care coordination needs.   Review of patient status, including review of consultants reports, relevant laboratory and other test results, and collaboration with appropriate care team members and the patient's provider was performed as part of comprehensive patient evaluation and provision of chronic care management services.    I spoke with Jean [name] by telephone today.  Objective:   Goals Addressed            This Visit's Progress     Patient Stated   . "I am having a hard time affording my medications" (pt-stated)       Current Barriers:  . Financial constraints  . Currently in a Medicare coverage gap  . Lacks knowledge of resources such as patient assistance   Clinical Social Work Clinical Goal(s):  [name]. Over the next 30 days, patient will work with CCM RPh to address needs related to medication cost  Interventions: . Patient interviewed and appropriate assessments performed . Provided patient with information about CCM team  . Discussed plans with patient for ongoing care management follow up and provided patient with direct contact information for care management team  . Determined the patient has contacted her insurance plan who reports she is in a coverage gap which has led to an increase in medication costs  . Patient agreeable to participate in patient assistance program with [employer] for Tradjenta. Educated the patient on patient assistance programs available pending income, spending requirements, and drug manufacturer specifications.  . Collaboration with PCP and [facility] CPhT, Lilla ShookAshley Coleman regarding  application process . Patient has been approved for Tradjenta patient assistance.  Informed patient that delivery of Tradjenta should be any day now.  She was denied for Sensipar patient assistance due to insurance offering copay.  Patient still pays over $200 for 30-day supply for the generic.  It is a tier 4 generic.  Requested that Dr. Allena KatzPatel (nephrologist) place a tier exception request.  . Will complete this goal once patient has medication delivered.  Patient Self Care Activities:  . Self administers medications as prescribed . Attends all scheduled provider appointments . Calls pharmacy for medication refills  Please see past updates related to this goal by clicking on the "Past Updates" button in the selected goal      . I want to work on controling my diabetes (pt-stated)       Current Barriers:  . Non Adherence to prescribed medication regimen  Pharmacist Clinical Goal(s):  [name]. Over the next 30 days, patient will demonstrate Improved medication adherence as evidenced by controlled blood sugars & improved quality of life . Over the next 60 days, patient will work with PharmD and PCP to address needs related to optimized medication management of chronic conditions  Interventions: . Comprehensive medication review performed. No changes to current regimen.  Patient tolerating well.  She denies adverse events. . Encouraged patient to continue eating a diabetic-healthy diet and exercise as able.  Patient reports her FBG was 121 this morning!  She states her FBG are mostly<130 and she denies hypoglycemia.   . Patient's A1c has decreased from 9.1 to 7.5 on 12/09/18.  She  states she is continuing to eat better and make healthier decisions.  She is encouraged by the improved labs and will continue to adhere to regimen & lifestyle. . Patient denies adverse events from medications and is tolerating current regimen without concerns. . She reports 100% compliance with medication regimen  Patient  Self Care Activities:  . Self administers medications as prescribed . Attends all scheduled provider appointments . Calls pharmacy for medication refills  Please see past updates related to this goal by clicking on the "Past Updates" button in the selected goal           Plan:   The care management team will reach out to the patient again over the next 3 weeks.  Regina Eck, PharmD, BCPS Clinical Pharmacist, [facility] [place]: [phone]

## 2019-02-02 ENCOUNTER — Ambulatory Visit: Payer: Self-pay | Admitting: Pharmacist

## 2019-02-02 DIAGNOSIS — I129 Hypertensive chronic kidney disease with stage 1 through stage 4 chronic kidney disease, or unspecified chronic kidney disease: Secondary | ICD-10-CM

## 2019-02-02 DIAGNOSIS — E7800 Pure hypercholesterolemia, unspecified: Secondary | ICD-10-CM

## 2019-02-02 DIAGNOSIS — N182 Chronic kidney disease, stage 2 (mild): Secondary | ICD-10-CM

## 2019-02-02 DIAGNOSIS — E1122 Type 2 diabetes mellitus with diabetic chronic kidney disease: Secondary | ICD-10-CM

## 2019-02-03 ENCOUNTER — Other Ambulatory Visit: Payer: Self-pay | Admitting: Internal Medicine

## 2019-02-03 NOTE — Progress Notes (Signed)
  Chronic Care Management   Visit Note  02/02/2019 Name: Jean [name] MRN: [id number] DOB: 11-06-1940  Referred by: Glendale Chard, MD Reason for referral : Chronic Care Management   Jean [name] is a 78 y.o. year old female who is a primary care patient of Glendale Chard, MD. The CCM team was consulted for assistance with chronic disease management and care coordination needs.   Review of patient status, including review of consultants reports, relevant laboratory and other test results, and collaboration with appropriate care team members and the patient's provider was performed as part of comprehensive patient evaluation and provision of chronic care management services.    I spoke with Jean [name] by telephone today.  Objective:   Goals Addressed            This Visit's Progress     Patient Stated   . COMPLETED: "I am having a hard time affording my medications" (pt-stated)       Current Barriers:  . Financial constraints  . Currently in a Medicare coverage gap  . Lacks knowledge of resources such as patient assistance   Clinical Social Work Clinical Goal(s):  [name] Over the next 30 days, patient will work with [place] to address needs related to medication cost  Interventions: . Patient interviewed and appropriate assessments performed . Provided patient with information about CCM team  . Discussed plans with patient for ongoing care management follow up and provided patient with direct contact information for care management team  . Determined the patient has contacted her insurance plan who reports she is in a coverage gap which has led to an increase in medication costs  . Patient agreeable to participate in patient assistance program with [employer] for [place]. Educated the patient on patient assistance programs available pending income, spending requirements, and drug manufacturer specifications.  . Collaboration with PCP and [facility] CPhT, Etter Sjogren  regarding application process . Patient has been approved for Tradjenta patient assistance.  Patient has received Tradjenta delivery.  Instructed patient how to call in refills every 3 months. . Patient was denied for Sensipar patient assistance due to insurance offering copay (although copay is expensive).  Patient pays over $200 for 30-day supply of generic Sensipar generic.  It is a tier 4 generic.  Request sent to Dr. Posey Pronto (nephrologist) to place a tier exception request.    Patient Self Care Activities:  . Self administers medications as prescribed . Attends all scheduled provider appointments . Calls pharmacy for medication refills  Please see past updates related to this goal by clicking on the "Past Updates" button in the selected goal          Plan:   The care management team will reach out to the patient again over the next month.   Regina Eck, PharmD, BCPS Clinical Pharmacist, [facility] [place]: [phone]

## 2019-02-03 NOTE — Patient Instructions (Signed)
Visit Information  Goals Addressed            This Visit's Progress     Patient Stated   . COMPLETED: "I am having a hard time affording my medications" (pt-stated)       Current Barriers:  . Financial constraints  . Currently in a Medicare coverage gap  . Lacks knowledge of resources such as patient assistance   Clinical Social Work Clinical Goal(s):  [name] Over the next 30 days, patient will work with [place] to address needs related to medication cost  Interventions: . Patient interviewed and appropriate assessments performed . Provided patient with information about CCM team  . Discussed plans with patient for ongoing care management follow up and provided patient with direct contact information for care management team  . Determined the patient has contacted her insurance plan who reports she is in a coverage gap which has led to an increase in medication costs  . Patient agreeable to participate in patient assistance program with [employer] for [place]. Educated the patient on patient assistance programs available pending income, spending requirements, and drug manufacturer specifications.  . Collaboration with PCP and [facility] CPhT, Etter Sjogren regarding application process . Patient has been approved for Tradjenta patient assistance.  Patient has received Tradjenta delivery.  Instructed patient how to call in refills every 3 months. . Patient was denied for Sensipar patient assistance due to insurance offering copay (although copay is expensive).  Patient pays over $200 for 30-day supply of generic Sensipar generic.  It is a tier 4 generic.  Request sent to Dr. Posey Pronto (nephrologist) to place a tier exception request.    Patient Self Care Activities:  . Self administers medications as prescribed . Attends all scheduled provider appointments . Calls pharmacy for medication refills  Please see past updates related to this goal by clicking on the "Past Updates" button in  the selected goal         The patient verbalized understanding of instructions provided today and declined a print copy of patient instruction materials.   The care management team will reach out to the patient again over the next month.  Regina Eck, PharmD, BCPS Clinical Pharmacist, [facility] [place]: [phone]

## 2019-02-05 ENCOUNTER — Telehealth: Payer: Self-pay

## 2019-02-05 ENCOUNTER — Other Ambulatory Visit: Payer: Self-pay

## 2019-02-25 ENCOUNTER — Ambulatory Visit: Payer: Self-pay | Admitting: Pharmacist

## 2019-02-25 DIAGNOSIS — N182 Chronic kidney disease, stage 2 (mild): Secondary | ICD-10-CM

## 2019-02-25 DIAGNOSIS — E7800 Pure hypercholesterolemia, unspecified: Secondary | ICD-10-CM

## 2019-02-25 DIAGNOSIS — E1122 Type 2 diabetes mellitus with diabetic chronic kidney disease: Secondary | ICD-10-CM

## 2019-02-25 DIAGNOSIS — I129 Hypertensive chronic kidney disease with stage 1 through stage 4 chronic kidney disease, or unspecified chronic kidney disease: Secondary | ICD-10-CM

## 2019-03-02 NOTE — Patient Instructions (Signed)
Visit Information  Goals Addressed            This Visit's Progress     Patient Stated   . "I am having a hard time affording my medications" (pt-stated)       Current Barriers:  . Financial constraints  . Currently in a Medicare coverage gap  . Lacks knowledge of resources such as patient assistance   Clinical Social Work Clinical Goal(s):  [name] Over the next 30 days, patient will work with [place] to address needs related to medication cost  Interventions: . Patient interviewed and appropriate assessments performed . Provided patient with information about CCM team  . Discussed plans with patient for ongoing care management follow up and provided patient with direct contact information for care management team  . Determined the patient has contacted her insurance plan who reports she is in a coverage gap which has led to an increase in medication costs  . Patient agreeable to participate in patient assistance program with [employer] for Tradjenta (completed). Educated the patient on patient assistance programs available pending income, spending requirements, and drug manufacturer specifications.  . Patient has been approved for Tradjenta patient assistance.  Patient has received Tradjenta delivery.  Instructed patient how to call in refills every 3 months. . Patient was denied for Sensipar patient assistance due to insurance offering copay (although copay is expensive).  Sensipar is a tier 4 generic per patient's insurance.  Next Sensipar refill is due now, however will push patient into the coverage gap making copay >$700 for 90-day supply.  Patient is unable to afford this.  Company does not offer help to patient's in the coverage gap.  Patient encouraged to call Nephrologist for additional options.   Patient Self Care Activities:  . Self administers medications as prescribed . Attends all scheduled provider appointments . Calls pharmacy for medication refills  Please see  past updates related to this goal by clicking on the "Past Updates" button in the selected goal      . I want to work on controling my diabetes (pt-stated)       Current Barriers:  . Non Adherence to prescribed medication regimen  Pharmacist Clinical Goal(s):  [name] Over the next 60 days, patient will demonstrate Improved medication adherence as evidenced by controlled blood sugars & improved quality of life . Over the next 90 days, patient will work with PharmD and PCP to address needs related to optimized medication management of chronic conditions  Interventions: . Comprehensive medication review performed. No changes to current regimen.  Patient tolerating well.  She denies adverse events. . Encouraged patient to continue eating a diabetic-healthy diet and exercise as able.  Patient reports her FBG was 118 this morning. She states her FBG are mostly<130 and she denies hypoglycemia.   . Patient's A1c has decreased from 9.1 to 7.5 on 12/09/18.  She states she is continuing to eat better and make healthier decisions.  She is encouraged by the improved labs and will continue to adhere to regimen & lifestyle. . Patient denies adverse events from medications and is tolerating current regimen without concerns. . She reports 100% compliance with medication regimen  Patient Self Care Activities:  . Self administers medications as prescribed . Attends all scheduled provider appointments . Calls pharmacy for medication refills  Please see past updates related to this goal by clicking on the "Past Updates" button in the selected goal          The patient verbalized  understanding of instructions provided today and declined a print copy of patient instruction materials.   The care management team will reach out to the patient again over the next 3-4 weeks.  Kieth BrightlyJulie Dattero Rayya Yagi, PharmD, BCPS Clinical Pharmacist, [facility]  [facility]  Direct  Dial: [phone]

## 2019-03-02 NOTE — Progress Notes (Signed)
Chronic Care Management   Visit Note  02/25/2019 Name: Jean [name] MRN: [id number] DOB: Aug 12, 1940  Referred by: Glendale Chard, MD Reason for referral : Chronic Care Management   Jean [name] is a 78 y.o. year old female who is a primary care patient of Glendale Chard, MD. The CCM team was consulted for assistance with chronic disease management and care coordination needs.   Review of patient status, including review of consultants reports, relevant laboratory and other test results, and collaboration with appropriate care team members and the patient's provider was performed as part of comprehensive patient evaluation and provision of chronic care management services.    I spoke with Jean [name] by telephone today.  Objective:   Goals Addressed            This Visit's Progress     Patient Stated   . "I am having a hard time affording my medications" (pt-stated)       Current Barriers:  . Financial constraints  . Currently in a Medicare coverage gap  . Lacks knowledge of resources such as patient assistance   Clinical Social Work Clinical Goal(s):  [name] Over the next 30 days, patient will work with [place] to address needs related to medication cost  Interventions: . Patient interviewed and appropriate assessments performed . Provided patient with information about CCM team  . Discussed plans with patient for ongoing care management follow up and provided patient with direct contact information for care management team  . Determined the patient has contacted her insurance plan who reports she is in a coverage gap which has led to an increase in medication costs  . Patient agreeable to participate in patient assistance program with [employer] for Tradjenta (completed). Educated the patient on patient assistance programs available pending income, spending requirements, and drug manufacturer specifications.  . Patient has been approved for Tradjenta patient  assistance.  Patient has received Tradjenta delivery.  Instructed patient how to call in refills every 3 months. . Patient was denied for Sensipar patient assistance due to insurance offering copay (although copay is expensive).  Sensipar is a tier 4 generic per patient's insurance.  Next Sensipar refill is due now, however will push patient into the coverage gap making copay >$700 for 90-day supply.  Patient is unable to afford this.  Company does not offer help to patient's in the coverage gap.  Patient encouraged to call Nephrologist for additional options.   Patient Self Care Activities:  . Self administers medications as prescribed . Attends all scheduled provider appointments . Calls pharmacy for medication refills  Please see past updates related to this goal by clicking on the "Past Updates" button in the selected goal      . I want to work on controling my diabetes (pt-stated)       Current Barriers:  . Non Adherence to prescribed medication regimen  Pharmacist Clinical Goal(s):  [name] Over the next 60 days, patient will demonstrate Improved medication adherence as evidenced by controlled blood sugars & improved quality of life . Over the next 90 days, patient will work with PharmD and PCP to address needs related to optimized medication management of chronic conditions  Interventions: . Comprehensive medication review performed. No changes to current regimen.  Patient tolerating well.  She denies adverse events. . Encouraged patient to continue eating a diabetic-healthy diet and exercise as able.  Patient reports her FBG was 118 this morning. She states her FBG are mostly<130 and she denies  hypoglycemia.   . Patient's A1c has decreased from 9.1 to 7.5 on 12/09/18.  She states she is continuing to eat better and make healthier decisions.  She is encouraged by the improved labs and will continue to adhere to regimen & lifestyle. . Patient denies adverse events from medications and is  tolerating current regimen without concerns. . She reports 100% compliance with medication regimen  Patient Self Care Activities:  . Self administers medications as prescribed . Attends all scheduled provider appointments . Calls pharmacy for medication refills  Please see past updates related to this goal by clicking on the "Past Updates" button in the selected goal           Plan:   The care management team will reach out to the patient again over the next 3-4 weeks.  Kieth BrightlyJulie Dattero Laine Fonner, PharmD, BCPS Clinical Pharmacist, [facility]  [facility]  Direct Dial: [phone]

## 2019-03-03 ENCOUNTER — Ambulatory Visit (INDEPENDENT_AMBULATORY_CARE_PROVIDER_SITE_OTHER): Payer: Medicare Other | Admitting: Pharmacist

## 2019-03-03 DIAGNOSIS — I129 Hypertensive chronic kidney disease with stage 1 through stage 4 chronic kidney disease, or unspecified chronic kidney disease: Secondary | ICD-10-CM | POA: Diagnosis not present

## 2019-03-03 DIAGNOSIS — E7800 Pure hypercholesterolemia, unspecified: Secondary | ICD-10-CM | POA: Diagnosis not present

## 2019-03-03 DIAGNOSIS — E1122 Type 2 diabetes mellitus with diabetic chronic kidney disease: Secondary | ICD-10-CM

## 2019-03-03 DIAGNOSIS — N182 Chronic kidney disease, stage 2 (mild): Secondary | ICD-10-CM | POA: Diagnosis not present

## 2019-03-04 NOTE — Patient Instructions (Signed)
Visit Information  Goals Addressed            This Visit's Progress     Patient Stated   . COMPLETED: "I am having a hard time affording my medications" (pt-stated)       Current Barriers:  . Financial constraints  . Currently in the Medicare coverage gap  . Lacks knowledge of resources such as patient assistance   Clinical Social Work Clinical Goal(s):  [name] Over the next 60 days, patient will work with [place] to address needs related to medication cost  Interventions: . Patient interviewed and appropriate assessments performed . Provided patient with information about CCM team  . Discussed plans with patient for ongoing care management follow up and provided patient with direct contact information for care management team  . Determined the patient has contacted her insurance plan who reports she is in a coverage gap which has led to an increase in medication costs  . Patient agreeable to participate in patient assistance program with [employer] for Tradjenta (completed). Educated the patient on patient assistance programs available pending income, spending requirements, and drug manufacturer specifications.  Updates: call completed with Ms. Quin and [employer] on 03/04/2019 . Patient has been approved for Tradjenta patient assistance.  Patient has received Tradjenta delivery (3 month supply).  Instructed patient how to call in refills every 3 months. . Patient was denied for Sensipar patient assistance due to insurance offering copay (although copay is expensive).  Sensipar is a tier 4 generic per patient's insurance.  Next Sensipar refill is due now, however will push patient into the coverage gap making copay >$700 for 90-day supply.  Patient is unable to afford this.  Company does not offer help to patient's in the coverage gap.  Patient encouraged to call Nephrologist for additional options. [name] [facility] now willing to work with patient and only send 30 days supply and allow  patient to pay over time.  This will allow patient to stay on therapy.  She reports altered mental status when she is off therapy.  She is on this therapy to avoid surgery.  She continues to work with nephrologist on this condition.   Patient Self Care Activities:  . Self administers medications as prescribed . Attends all scheduled provider appointments . Calls pharmacy for medication refills  Please see past updates related to this goal by clicking on the "Past Updates" button in the selected goal         The patient verbalized understanding of instructions provided today and declined a print copy of patient instruction materials.   The care management team will reach out to the patient again over the next 2 weeks.  Regina Eck, PharmD, BCPS Clinical Pharmacist, [facility] [place]: [phone]

## 2019-03-04 NOTE — Progress Notes (Signed)
  Chronic Care Management   Visit Note  03/03/2019 Name: Jean [name] MRN: [id number] DOB: 1941-04-11  Referred by: Glendale Chard, MD Reason for referral : Chronic Care Management   Jean [name] is a 78 y.o. year old female who is a primary care patient of Glendale Chard, MD. The CCM team was consulted for assistance with chronic disease management and care coordination needs.   Review of patient status, including review of consultants reports, relevant laboratory and other test results, and collaboration with appropriate care team members and the patient's provider was performed as part of comprehensive patient evaluation and provision of chronic care management services.    I spoke with Jean [name] by telephone today.  Objective:   Goals Addressed            This Visit's Progress     Patient Stated   . COMPLETED: "I am having a hard time affording my medications" (pt-stated)       Current Barriers:  . Financial constraints  . Currently in the Medicare coverage gap  . Lacks knowledge of resources such as patient assistance   Clinical Social Work Clinical Goal(s):  [name] Over the next 60 days, patient will work with [place] to address needs related to medication cost  Interventions: . Patient interviewed and appropriate assessments performed . Provided patient with information about CCM team  . Discussed plans with patient for ongoing care management follow up and provided patient with direct contact information for care management team  . Determined the patient has contacted her insurance plan who reports she is in a coverage gap which has led to an increase in medication costs  . Patient agreeable to participate in patient assistance program with [employer] for Tradjenta (completed). Educated the patient on patient assistance programs available pending income, spending requirements, and drug manufacturer specifications.  Updates: call completed with Jean [name] and  [employer] on 03/04/2019 . Patient has been approved for Tradjenta patient assistance.  Patient has received Tradjenta delivery (3 month supply).  Instructed patient how to call in refills every 3 months. . Patient was denied for Sensipar patient assistance due to insurance offering copay (although copay is expensive).  Sensipar is a tier 4 generic per patient's insurance.  Next Sensipar refill is due now, however will push patient into the coverage gap making copay >$700 for 90-day supply.  Patient is unable to afford this.  Company does not offer help to patient's in the coverage gap.  Patient encouraged to call Nephrologist for additional options. [name] [facility] now willing to work with patient and only send 30 days supply and allow patient to pay over time.  This will allow patient to stay on therapy.  She reports altered mental status when she is off therapy.  She is on this therapy to avoid surgery.  She continues to work with nephrologist on this condition.   Patient Self Care Activities:  . Self administers medications as prescribed . Attends all scheduled provider appointments . Calls pharmacy for medication refills  Please see past updates related to this goal by clicking on the "Past Updates" button in the selected goal          Plan:   Face to Face appointment with care management team member scheduled for: 03/17/2019   Regina Eck, PharmD, BCPS Clinical Pharmacist, [facility] [place]: [phone]

## 2019-03-10 ENCOUNTER — Telehealth: Payer: Self-pay

## 2019-03-11 ENCOUNTER — Ambulatory Visit: Payer: Medicare Other | Admitting: Internal Medicine

## 2019-03-11 NOTE — Progress Notes (Signed)
This encounter was created in error - please disregard.

## 2019-03-17 ENCOUNTER — Ambulatory Visit (INDEPENDENT_AMBULATORY_CARE_PROVIDER_SITE_OTHER): Payer: Medicare Other | Admitting: Internal Medicine

## 2019-03-17 ENCOUNTER — Other Ambulatory Visit: Payer: Self-pay

## 2019-03-17 ENCOUNTER — Ambulatory Visit: Payer: Self-pay | Admitting: Pharmacist

## 2019-03-17 ENCOUNTER — Encounter: Payer: Self-pay | Admitting: Internal Medicine

## 2019-03-17 VITALS — BP 122/68 | HR 94 | Temp 98.6°F | Ht 64.6 in | Wt 152.6 lb

## 2019-03-17 DIAGNOSIS — N182 Chronic kidney disease, stage 2 (mild): Secondary | ICD-10-CM

## 2019-03-17 DIAGNOSIS — Z23 Encounter for immunization: Secondary | ICD-10-CM | POA: Diagnosis not present

## 2019-03-17 DIAGNOSIS — I129 Hypertensive chronic kidney disease with stage 1 through stage 4 chronic kidney disease, or unspecified chronic kidney disease: Secondary | ICD-10-CM

## 2019-03-17 DIAGNOSIS — E1122 Type 2 diabetes mellitus with diabetic chronic kidney disease: Secondary | ICD-10-CM

## 2019-03-17 DIAGNOSIS — E7800 Pure hypercholesterolemia, unspecified: Secondary | ICD-10-CM | POA: Diagnosis not present

## 2019-03-17 MED ORDER — GLUCOSE BLOOD VI STRP: ORAL_STRIP | 12 refills | Status: DC

## 2019-03-17 NOTE — Patient Instructions (Signed)
Preventing Influenza, Adult Influenza, more commonly known as "the flu," is a viral infection that mainly affects the respiratory tract. The respiratory tract includes structures that help you breathe, such as the lungs, nose, and throat. The flu causes many common cold symptoms, as well as a high fever and body aches. The flu spreads easily from person to person (is contagious). The flu is most common from December through March. This is called flu season.You can catch the flu virus by:  Breathing in droplets from an infected person's cough or sneeze.  Touching something that was recently contaminated with the virus and then touching your mouth, nose, or eyes. What can I do to lower my risk?        You can decrease your risk of getting the flu by:  Getting a flu shot (influenza vaccination) every year. This is the best way to prevent the flu. A flu shot is recommended for everyone age 6 months and older. ? It is best to get a flu shot in the fall, as soon as it is available. Getting a flu shot during winter or spring instead is still a good idea. Flu season can last into early spring. ? Preventing the flu through vaccination requires getting a new flu shot every year. This is because the flu virus changes slightly (mutates) from one year to the next. Even if a flu shot does not completely protect you from all flu virus mutations, it can reduce the severity of your illness and prevent dangerous complications of the flu. ? If you are pregnant, you can and should get a flu shot. ? If you have had a reaction to the shot in the past or if you are allergic to eggs, check with your health care provider before getting a flu shot. ? Sometimes the vaccine is available as a nasal spray. In some years, the nasal spray has not been as effective against the flu virus. Check with your health care provider if you have questions about this.  Practicing good health habits. This is especially important during  flu season. ? Avoid contact with people who are sick with flu or cold symptoms. ? Wash your hands with soap and water often. If soap and water are not available, use alcohol-based hand sanitizer. ? Avoid touching your hands to your face, especially when you have not washed your hands recently. ? Use a disinfectant to clean surfaces at home and at work that may be contaminated with the flu virus. ? Keep your body's disease-fighting system (immune system) in good shape by eating a healthy diet, drinking plenty of fluids, getting enough sleep, and exercising regularly. If you do get the flu, avoid spreading it to others by:  Staying home until your symptoms have been gone for at least one day.  Covering your mouth and nose when you cough or sneeze.  Avoiding close contact with others, especially babies and elderly people. Why are these changes important? Getting a flu shot and practicing good health habits protects you as well as other people. If you get the flu, your friends, family, and co-workers are also at risk of getting it, because it spreads so easily to others. Each year, about 2 out of every 10 people get the flu. Having the flu can lead to complications, such as pneumonia, ear infection, and sinus infection. The flu also can be deadly, especially for babies, people older than age 65, and people who have serious long-term diseases. How is this treated? Most   people recover from the flu by resting at home and drinking plenty of fluids. However, a prescription antiviral medicine may reduce your flu symptoms and may make your flu go away sooner. This medicine must be started within a few days of getting flu symptoms. You can talk with your health care provider about whether you need an antiviral medicine. Antiviral medicine may be prescribed for people who are at risk for more serious flu symptoms. This includes people who:  Are older than age 65.  Are pregnant.  Have a condition that  makes the flu worse or more dangerous. Where to find more information  Centers for Disease Control and Prevention: www.cdc.gov/flu/index.htm  Flu.gov: www.flu.gov/prevention-vaccination  American Academy of Family Physicians: familydoctor.org/familydoctor/en/kids/vaccines/preventing-the-flu.html Contact a health care provider if:  You have influenza and you develop new symptoms.  You have: ? Chest pain. ? Diarrhea. ? A fever.  Your cough gets worse, or you produce more mucus. Summary  The best way to prevent the flu is to get a flu shot every year in the fall.  Even if you get the flu after you have received the yearly vaccine, your flu may be milder and go away sooner because of your flu shot.  If you get the flu, antiviral medicines that are started with a few days of symptoms may reduce your flu symptoms and may make your flu go away sooner.  You can also help prevent the flu by practicing good health habits. This information is not intended to replace advice given to you by your health care provider. Make sure you discuss any questions you have with your health care provider. Document Released: 07/30/2015 Document Revised: 06/27/2017 Document Reviewed: 03/23/2016 Elsevier Patient Education  2020 Elsevier Inc.  

## 2019-03-18 ENCOUNTER — Telehealth: Payer: Self-pay

## 2019-03-18 DIAGNOSIS — E1122 Type 2 diabetes mellitus with diabetic chronic kidney disease: Secondary | ICD-10-CM

## 2019-03-18 DIAGNOSIS — N182 Chronic kidney disease, stage 2 (mild): Secondary | ICD-10-CM

## 2019-03-18 LAB — BMP8+EGFR
BUN/Creatinine Ratio: 15 (ref 12–28)
BUN: 17 mg/dL (ref 8–27)
CO2: 22 mmol/L (ref 20–29)
Calcium: 11 mg/dL — ABNORMAL HIGH (ref 8.7–10.3)
Chloride: 101 mmol/L (ref 96–106)
Creatinine, Ser: 1.17 mg/dL — ABNORMAL HIGH (ref 0.57–1.00)
GFR calc Af Amer: 51 mL/min/{1.73_m2} — ABNORMAL LOW (ref >59)
GFR calc non Af Amer: 44 mL/min/{1.73_m2} — ABNORMAL LOW (ref >59)
Glucose: 119 mg/dL — ABNORMAL HIGH (ref 65–99)
Potassium: 4.6 mmol/L (ref 3.5–5.2)
Sodium: 139 mmol/L (ref 134–144)

## 2019-03-18 LAB — LIPID PANEL
Chol/HDL Ratio: 3.3 ratio (ref 0.0–4.4)
Cholesterol, Total: 186 mg/dL (ref 100–199)
HDL: 57 mg/dL (ref >39)
LDL Calculated: 83 mg/dL (ref 0–99)
Triglycerides: 228 mg/dL — ABNORMAL HIGH (ref 0–149)
VLDL Cholesterol Cal: 46 mg/dL — ABNORMAL HIGH (ref 5–40)

## 2019-03-18 LAB — HEMOGLOBIN A1C
Est. average glucose Bld gHb Est-mCnc: 151 mg/dL
Hgb A1c MFr Bld: 6.9 % — ABNORMAL HIGH (ref 4.8–5.6)

## 2019-03-18 MED ORDER — GLUCOSE BLOOD VI STRP: ORAL_STRIP | 2 refills | Status: DC

## 2019-03-18 NOTE — Telephone Encounter (Signed)
error 

## 2019-03-24 ENCOUNTER — Other Ambulatory Visit: Payer: Self-pay

## 2019-03-24 NOTE — Patient Outreach (Signed)
[facility]) Care Management  03/24/2019  PEGGIE HORNAK 1940-10-10 [id number]   Medication Adherence call to Mrs. Yocelin Xiang Hippa Identifiers Verify spoke with patients she is past due rosuvastatin 20 mg she explain she takes 1 tablet daily patient has medication at this time for another month and will order she explain she does not miss a dose.Mrs. Porco is showing past due under [place].   [place] Management Direct Dial [phone]  Fax [phone] [email or web address]

## 2019-03-26 NOTE — Progress Notes (Signed)
Subjective:     Patient ID: Jean [name] , female    DOB: 05-May-1941 , 78 y.o.   MRN: [id number]   Chief Complaint  Patient presents with  . Diabetes  . Hypertension    HPI  Diabetes She presents for her follow-up diabetic visit. She has type 2 diabetes mellitus. Her disease course has been worsening. There are no hypoglycemic associated symptoms. Pertinent negatives for diabetes include no blurred vision and no chest pain. There are no hypoglycemic complications. Diabetic complications include nephropathy. Risk factors for coronary artery disease include diabetes mellitus, dyslipidemia, hypertension and post-menopausal. Current diabetic treatment includes oral agent (monotherapy). She is compliant with treatment most of the time. She is following a diabetic diet. She participates in exercise intermittently. Her home blood glucose trend is decreasing steadily. An ACE inhibitor/angiotensin II receptor blocker is being taken. Eye exam is not current.  Hypertension This is a chronic problem. The current episode started more than 1 year ago. The problem has been gradually improving since onset. The problem is controlled. Pertinent negatives include no blurred vision, chest pain, palpitations or shortness of breath. Risk factors for coronary artery disease include diabetes mellitus and dyslipidemia. Past treatments include ACE inhibitors. The current treatment provides moderate improvement. Hypertensive end-organ damage includes kidney disease.     Past Medical History:  Diagnosis Date  . Blood transfusion without reported diagnosis    No reaction   . Diabetes mellitus without complication ([facility])   . Hypertension      Family History  Problem Relation Age of Onset  . Breast cancer Sister   . Breast cancer Sister   . Healthy Mother   . Healthy Father      Current Outpatient Medications:  .  amLODipine (NORVASC) 10 MG tablet, Take 1 tablet (10 mg total) by mouth daily., Disp: 90 tablet,  Rfl: 1 .  aspirin EC 81 MG tablet, Take 81 mg by mouth daily., Disp: , Rfl:  .  benazepril (LOTENSIN) 20 MG tablet, TAKE 1 TABLET BY MOUTH  DAILY, Disp: 90 tablet, Rfl: 1 .  cholecalciferol (VITAMIN D) 1000 UNITS tablet, Take 1,000 Units by mouth daily., Disp: , Rfl:  .  cinacalcet (SENSIPAR) 30 MG tablet, Take 30 mg by mouth daily., Disp: , Rfl:  .  rosuvastatin (CRESTOR) 20 MG tablet, Take 1 tablet (20 mg total) by mouth daily., Disp: 90 tablet, Rfl: 1 .  TRADJENTA 5 MG TABS tablet, TAKE ONE TABLET BY MOUTH ONCE DAILY, Disp: 90 tablet, Rfl: 5 .  glucose blood test strip, Use as instructed to check blood sugars 2 times per day dx: e11.22, Disp: 300 each, Rfl: 2   Allergies  Allergen Reactions  . Penicillins Anaphylaxis     Review of Systems  Constitutional: Negative.   Eyes: Negative for blurred vision.  Respiratory: Negative.  Negative for shortness of breath.   Cardiovascular: Negative.  Negative for chest pain and palpitations.  Gastrointestinal: Negative.   Neurological: Negative.   Psychiatric/Behavioral: Negative.      Today's Vitals   03/17/19 1209  BP: 122/68  Pulse: 94  Temp: 98.6 F (37 C)  TempSrc: Oral  SpO2: 97%  Weight: 152 lb 9.6 oz (69.2 kg)  Height: 5' 4.6" (1.641 m)   Body mass index is 25.71 kg/m.   Objective:  Physical Exam Vitals signs and nursing note reviewed.  Constitutional:      Appearance: Normal appearance.  HENT:     Head: Normocephalic and atraumatic.  Cardiovascular:  Rate and Rhythm: Normal rate and regular rhythm.     Heart sounds: Normal heart sounds.  Pulmonary:     Effort: Pulmonary effort is normal.     Breath sounds: Normal breath sounds.  Skin:    General: Skin is warm.  Neurological:     General: No focal deficit present.     Mental Status: She is alert.  Psychiatric:        Mood and Affect: Mood normal.        Behavior: Behavior normal.         Assessment And Plan:     1. Type 2 diabetes mellitus with stage  2 chronic kidney disease, without long-term current use of insulin (HCC)  Chronic. Importance of regular exercise was discussed with the patient.  I will check GFR, Cr today. She is encouraged to stay well hydrated.   - Hemoglobin A1c - BMP8+EGFR - Lipid panel  2. Hypertensive nephropathy  Chronic, well controlled. She will continue with current meds.  She is encouraged to avoid adding salt to her foods.   3. Pure hypercholesterolemia  Chronic. She reports compliance with meds. I will check non-fasting lipid panel. She is encouraged to exercise no less than five days weekly and to avoid fried foods.   4. Encounter for immunization  - Flu vaccine HIGH DOSE PF (Fluzone High dose)        [name], MD    THE PATIENT IS ENCOURAGED TO PRACTICE SOCIAL DISTANCING DUE TO THE COVID-19 PANDEMIC.

## 2019-04-13 ENCOUNTER — Other Ambulatory Visit: Payer: Self-pay | Admitting: Internal Medicine

## 2019-04-13 ENCOUNTER — Other Ambulatory Visit: Payer: Self-pay

## 2019-04-13 NOTE — Patient Outreach (Signed)
Jean [name]  04/13/2019  Jean [name] 1941-01-03 [id number]   Medication Adherence call to Jean [name] Hippa Identifiers Verify spoke with patient she is past due on Rosuvastatin 20 mg patient explain she takes 1 tablet daily patient has medication at this time but ask if we can call [facility] an order this medication [facility] will mail out with 5-7 days.Jean [name] is showing past due under [place].   [place] [name] Direct Dial [phone]  Fax [phone] [email or web address]

## 2019-04-22 ENCOUNTER — Telehealth: Payer: Self-pay

## 2019-05-26 ENCOUNTER — Telehealth: Payer: Self-pay

## 2019-06-01 ENCOUNTER — Other Ambulatory Visit: Payer: Self-pay | Admitting: Internal Medicine

## 2019-06-15 ENCOUNTER — Ambulatory Visit (INDEPENDENT_AMBULATORY_CARE_PROVIDER_SITE_OTHER): Payer: Medicare Other | Admitting: Pharmacist

## 2019-06-15 ENCOUNTER — Other Ambulatory Visit: Payer: Self-pay

## 2019-06-15 DIAGNOSIS — E1122 Type 2 diabetes mellitus with diabetic chronic kidney disease: Secondary | ICD-10-CM | POA: Diagnosis not present

## 2019-06-15 DIAGNOSIS — I129 Hypertensive chronic kidney disease with stage 1 through stage 4 chronic kidney disease, or unspecified chronic kidney disease: Secondary | ICD-10-CM

## 2019-06-15 DIAGNOSIS — N182 Chronic kidney disease, stage 2 (mild): Secondary | ICD-10-CM

## 2019-06-15 NOTE — Patient Outreach (Signed)
[facility]) Care Management  06/15/2019  KITRINA MAURIN 11/27/1940 [id number]   Medication Adherence call to Mrs. Ester spoke with patient she is past due on Tradjenta 5 mg,patient explain she takes 1 tablet daily and is now receiving it thru patients assistance,patient has enough medication until the end of the year,Mrs. Aye is showing past due under [place].   [place] Management Direct Dial [phone]  Fax [phone] [email or web address]

## 2019-06-20 NOTE — Progress Notes (Signed)
Chronic Care Management    Visit Note  06/15/2019 Name: Jean [name] MRN: [id number] DOB: [birth year]  Referred by: [name], MD Reason for referral : Chronic Care Management   Jean [name] is a 78 y.o. year old female who is a primary care patient of [name], MD. The CCM team was consulted for assistance with chronic disease management and care coordination needs related to HLD and DMII  Review of patient status, including review of consultants reports, relevant laboratory and other test results, and collaboration with appropriate care team members and the patient's provider was performed as part of comprehensive patient evaluation and provision of chronic care management services.    I spoke with Ms. Sedano by telephone today.  Medications: Outpatient Encounter Medications as of 06/15/2019  Medication Sig  . amLODipine (NORVASC) 10 MG tablet TAKE 1 TABLET BY MOUTH  DAILY  . aspirin EC 81 MG tablet Take 81 mg by mouth daily.  . benazepril (LOTENSIN) 20 MG tablet TAKE 1 TABLET BY MOUTH  DAILY  . cholecalciferol (VITAMIN D) 1000 UNITS tablet Take 1,000 Units by mouth daily.  . cinacalcet (SENSIPAR) 30 MG tablet Take 30 mg by mouth daily.  [name] glucose blood test strip Use as instructed to check blood sugars 2 times per day dx: e11.22  . rosuvastatin (CRESTOR) 20 MG tablet TAKE 1 TABLET BY MOUTH  DAILY  . TRADJENTA 5 MG TABS tablet TAKE ONE TABLET BY MOUTH ONCE DAILY   No facility-administered encounter medications on file as of 06/15/2019.      Objective:   Goals Addressed            This Visit's Progress     Patient Stated   . I want to work on controling my diabetes (pt-stated)       Current Barriers:  . Non Adherence to prescribed medication regimen  Pharmacist Clinical Goal(s):  [name] Over the next 90 days, patient will demonstrate Improved medication adherence as evidenced by controlled blood sugars & improved quality of life . Over the next 90 days,  patient will work with PharmD and PCP to address needs related to optimized medication management of chronic conditions  Interventions: . Comprehensive medication review performed. No changes to current regimen.  Patient tolerating well.  She denies adverse events. . Encouraged patient to continue eating a diabetic-healthy diet and exercise as able.  Patient reports her FBG was 109 this morning. She states her FBG are mostly <130 and she denies hypoglycemia.   . Patient's A1c has decreased from 7.5% to 6.9% on 03/17/19 (was as high as 9.1% earlier this year).  She states she is continuing to eat better and make healthier decisions.  She is encouraged by the improved labs and will continue to adhere to regimen & lifestyle. . Rosuvastatin 20mg  qHS filled on 04/13/19.  Will ensure refill called in.  Patient reports compliance although this is the only 90 day fill this year per insurance data. . Patient denies adverse events from medications and is tolerating current regimen without concerns. . She reports 100% compliance with medication regimen  Patient Self Care Activities:  . Self administers medications as prescribed . Attends all scheduled provider appointments . Calls pharmacy for medication refills  Please see past updates related to this goal by clicking on the "Past Updates" button in the selected goal            Plan:   The care management team will reach out to the patient again  over the next 45 days.   Provider Signature   Regina Eck, PharmD, BCPS Clinical Pharmacist, [facility] [place]: [phone]

## 2019-06-20 NOTE — Patient Instructions (Signed)
Visit Information  Goals Addressed            This Visit's Progress     Patient Stated   . I want to work on controling my diabetes (pt-stated)       Current Barriers:  . Non Adherence to prescribed medication regimen  Pharmacist Clinical Goal(s):  [name] Over the next 90 days, patient will demonstrate Improved medication adherence as evidenced by controlled blood sugars & improved quality of life . Over the next 90 days, patient will work with PharmD and PCP to address needs related to optimized medication management of chronic conditions  Interventions: . Comprehensive medication review performed. No changes to current regimen.  Patient tolerating well.  She denies adverse events. . Encouraged patient to continue eating a diabetic-healthy diet and exercise as able.  Patient reports her FBG was 109 this morning. She states her FBG are mostly <130 and she denies hypoglycemia.   . Patient's A1c has decreased from 7.5% to 6.9% on 03/17/19 (was as high as 9.1% earlier this year).  She states she is continuing to eat better and make healthier decisions.  She is encouraged by the improved labs and will continue to adhere to regimen & lifestyle. . Rosuvastatin 20mg  qHS filled on 04/13/19.  Will ensure refill called in.  Patient reports compliance although this is the only 90 day fill this year per insurance data. . Patient denies adverse events from medications and is tolerating current regimen without concerns. . She reports 100% compliance with medication regimen  Patient Self Care Activities:  . Self administers medications as prescribed . Attends all scheduled provider appointments . Calls pharmacy for medication refills  Please see past updates related to this goal by clicking on the "Past Updates" button in the selected goal          The patient verbalized understanding of instructions provided today and declined a print copy of patient instruction materials.   The care management  team will reach out to the patient again over the next 45 days.   SIGNATURE .Regina Eck, PharmD, BCPS Clinical Pharmacist, [facility] [place]: [phone]

## 2019-07-13 ENCOUNTER — Other Ambulatory Visit: Payer: Self-pay

## 2019-07-13 NOTE — Patient Outreach (Signed)
[facility]) Care Management  07/13/2019  Jean [name] [birth year] [id number]   Medication Adherence call to Jean [name] Hippa Identifiers Verify spoke with patient she is past due on Rosuvastatin 20 mg,patient explain she takes 1 tablet daily,patient has a few,patient ask to call [facility] to order this medication.[facility] will mail out with in 7 days.patient has enough until she receives mail order. Jean [name] is showing past due under [place].  [place] Management Direct Dial [phone]  Fax [phone] [email or web address]

## 2019-07-15 ENCOUNTER — Ambulatory Visit (INDEPENDENT_AMBULATORY_CARE_PROVIDER_SITE_OTHER): Payer: Medicare Other | Admitting: Internal Medicine

## 2019-07-15 ENCOUNTER — Other Ambulatory Visit: Payer: Self-pay

## 2019-07-15 ENCOUNTER — Encounter: Payer: Self-pay | Admitting: Internal Medicine

## 2019-07-15 VITALS — BP 124/78 | HR 90 | Temp 97.9°F | Ht 64.6 in | Wt 153.0 lb

## 2019-07-15 DIAGNOSIS — N182 Chronic kidney disease, stage 2 (mild): Secondary | ICD-10-CM | POA: Diagnosis not present

## 2019-07-15 DIAGNOSIS — Z23 Encounter for immunization: Secondary | ICD-10-CM

## 2019-07-15 DIAGNOSIS — E1122 Type 2 diabetes mellitus with diabetic chronic kidney disease: Secondary | ICD-10-CM

## 2019-07-15 DIAGNOSIS — I129 Hypertensive chronic kidney disease with stage 1 through stage 4 chronic kidney disease, or unspecified chronic kidney disease: Secondary | ICD-10-CM

## 2019-07-15 DIAGNOSIS — G44209 Tension-type headache, unspecified, not intractable: Secondary | ICD-10-CM | POA: Diagnosis not present

## 2019-07-15 MED ORDER — TETANUS-DIPHTH-ACELL PERTUSSIS 5-2.5-18.5 LF-MCG/0.5 IM SUSP: 0.5000 mL | Freq: Once | INTRAMUSCULAR | 0 refills | Status: AC

## 2019-07-15 NOTE — Progress Notes (Signed)
This visit occurred during the SARS-CoV-2 public health emergency.  Safety protocols were in place, including screening questions prior to the visit, additional usage of staff PPE, and extensive cleaning of exam room while observing appropriate contact time as indicated for disinfecting solutions.  Subjective:     Patient ID: Jean [name] , female    DOB: 11-06-1940 , 78 y.o.   MRN: [id number]   Chief Complaint  Patient presents with  . Diabetes  . Blood type  . Hypertension    HPI  She is here today for a diabetes check. She also wants to know her blood type. She has no particular reason to want this information, but she wants to be prepared if this information is needed in the future.   Diabetes She presents for her follow-up diabetic visit. She has type 2 diabetes mellitus. Her disease course has been worsening. Hypoglycemia symptoms include headaches. Pertinent negatives for diabetes include no blurred vision. There are no hypoglycemic complications. Diabetic complications include nephropathy. Risk factors for coronary artery disease include diabetes mellitus, dyslipidemia, hypertension and post-menopausal. Current diabetic treatment includes oral agent (monotherapy). She is compliant with treatment most of the time. She is following a diabetic diet. She participates in exercise intermittently. Her home blood glucose trend is decreasing steadily. An ACE inhibitor/angiotensin II receptor blocker is being taken. Eye exam is not current.  Hypertension This is a chronic problem. The current episode started more than 1 year ago. The problem has been gradually improving since onset. The problem is controlled. Associated symptoms include headaches. Pertinent negatives include no blurred vision. Risk factors for coronary artery disease include diabetes mellitus and dyslipidemia. Past treatments include ACE inhibitors. The current treatment provides moderate improvement. Hypertensive end-organ  damage includes kidney disease.     Past Medical History:  Diagnosis Date  . Blood transfusion without reported diagnosis    No reaction   . Diabetes mellitus without complication ([place])   . Hypertension      Family History  Problem Relation Age of Onset  . Breast cancer Sister   . Breast cancer Sister   . Healthy Mother   . Healthy Father      Current Outpatient Medications:  .  amLODipine (NORVASC) 10 MG tablet, TAKE 1 TABLET BY MOUTH  DAILY, Disp: 90 tablet, Rfl: 3 .  aspirin EC 81 MG tablet, Take 81 mg by mouth daily., Disp: , Rfl:  .  benazepril (LOTENSIN) 20 MG tablet, TAKE 1 TABLET BY MOUTH  DAILY, Disp: 90 tablet, Rfl: 1 .  cholecalciferol (VITAMIN D) 1000 UNITS tablet, Take 1,000 Units by mouth daily., Disp: , Rfl:  .  cinacalcet (SENSIPAR) 30 MG tablet, Take 30 mg by mouth daily., Disp: , Rfl:  .  glucose blood test strip, Use as instructed to check blood sugars 2 times per day dx: e11.22, Disp: 300 each, Rfl: 2 .  rosuvastatin (CRESTOR) 20 MG tablet, TAKE 1 TABLET BY MOUTH  DAILY, Disp: 90 tablet, Rfl: 0 .  TRADJENTA 5 MG TABS tablet, TAKE ONE TABLET BY MOUTH ONCE DAILY, Disp: 90 tablet, Rfl: 5 .  Tdap (BOOSTRIX) 5-2.5-18.5 LF-MCG/0.5 injection, Inject 0.5 mLs into the muscle once for 1 dose., Disp: 0.5 mL, Rfl: 0   Allergies  Allergen Reactions  . Penicillins Anaphylaxis     Review of Systems  Constitutional: Negative.   Eyes: Negative for blurred vision.  Respiratory: Negative.   Cardiovascular: Negative.   Gastrointestinal: Negative.   Neurological: Positive for headaches.  She c/o occasional right-sided headache, sometimes at back of neck. Unable to state what triggers her sx. Denies visual disturbances, nausea, dizziness. Reports her bp readings have been normal.   Psychiatric/Behavioral: Negative.      Today's Vitals   07/15/19 0906  BP: 124/78  Pulse: 90  Temp: 97.9 F (36.6 C)  TempSrc: Oral  Weight: 153 lb (69.4 kg)  Height: 5' 4.6"  (1.641 m)  PainSc: 0-No pain   Body mass index is 25.78 kg/m.   Objective:  Physical Exam Vitals and nursing note reviewed.  Constitutional:      Appearance: Normal appearance.  HENT:     Head: Normocephalic and atraumatic.  Neck:     Comments: Bilateral trapezius mm.tender to palpation.  Cardiovascular:     Rate and Rhythm: Normal rate and regular rhythm.     Heart sounds: Normal heart sounds.  Pulmonary:     Effort: Pulmonary effort is normal.     Breath sounds: Normal breath sounds.  Skin:    General: Skin is warm.  Neurological:     General: No focal deficit present.     Mental Status: She is alert.  Psychiatric:        Mood and Affect: Mood normal.        Behavior: Behavior normal.         Assessment And Plan:     1. Type 2 diabetes mellitus with stage 2 chronic kidney disease, without long-term current use of insulin ([place])  I will check labs as listed below. I will make further recommendations once her labs are available for review. She will rto in 3-4 months for review.   - ABO AND RH  - CMP14+EGFR - Hemoglobin A1c  2. Hypertensive nephropathy  Chronic, well controlled. She will continue with current meds. She is encouraged to avoid adding salt to her foods.   3. Tension headache  She is advised to have her partner massage her neck with muscle rub nightly. She will let me know if her sx persist. She may also benefit from magnesium supplementation. She will let me know if her sx persist.   4. Immunization due  Rx Tdap(Boostrix) was sent to her pharmacy. She was also given pneumovax-23 during her visit.   [name], MD    THE PATIENT IS ENCOURAGED TO PRACTICE SOCIAL DISTANCING DUE TO THE COVID-19 PANDEMIC.

## 2019-07-16 LAB — ABO AND RH: Rh Factor: POSITIVE

## 2019-07-16 LAB — CMP14+EGFR
ALT: 21 IU/L (ref 0–32)
AST: 18 IU/L (ref 0–40)
Albumin/Globulin Ratio: 1.6 (ref 1.2–2.2)
Albumin: 4.9 g/dL — ABNORMAL HIGH (ref 3.7–4.7)
Alkaline Phosphatase: 129 IU/L — ABNORMAL HIGH (ref 39–117)
BUN/Creatinine Ratio: 14 (ref 12–28)
BUN: 13 mg/dL (ref 8–27)
Bilirubin Total: 0.6 mg/dL (ref 0.0–1.2)
CO2: 24 mmol/L (ref 20–29)
Calcium: 10.7 mg/dL — ABNORMAL HIGH (ref 8.7–10.3)
Chloride: 102 mmol/L (ref 96–106)
Creatinine, Ser: 0.95 mg/dL (ref 0.57–1.00)
GFR calc Af Amer: 66 mL/min/{1.73_m2} (ref >59)
GFR calc non Af Amer: 57 mL/min/{1.73_m2} — ABNORMAL LOW (ref >59)
Globulin, Total: 3 g/dL (ref 1.5–4.5)
Glucose: 74 mg/dL (ref 65–99)
Potassium: 5.1 mmol/L (ref 3.5–5.2)
Sodium: 140 mmol/L (ref 134–144)
Total Protein: 7.9 g/dL (ref 6.0–8.5)

## 2019-07-16 LAB — HEMOGLOBIN A1C
Est. average glucose Bld gHb Est-mCnc: 151 mg/dL
Hgb A1c MFr Bld: 6.9 % — ABNORMAL HIGH (ref 4.8–5.6)

## 2019-07-19 ENCOUNTER — Encounter: Payer: Self-pay | Admitting: Internal Medicine

## 2019-07-21 ENCOUNTER — Ambulatory Visit: Payer: Self-pay | Admitting: Pharmacist

## 2019-07-21 DIAGNOSIS — E7800 Pure hypercholesterolemia, unspecified: Secondary | ICD-10-CM

## 2019-07-21 DIAGNOSIS — N182 Chronic kidney disease, stage 2 (mild): Secondary | ICD-10-CM

## 2019-07-21 DIAGNOSIS — E1122 Type 2 diabetes mellitus with diabetic chronic kidney disease: Secondary | ICD-10-CM

## 2019-07-26 ENCOUNTER — Other Ambulatory Visit: Payer: Self-pay | Admitting: Pharmacy Technician

## 2019-07-26 ENCOUNTER — Ambulatory Visit (INDEPENDENT_AMBULATORY_CARE_PROVIDER_SITE_OTHER): Payer: Medicare Other | Admitting: Pharmacist

## 2019-07-26 DIAGNOSIS — E1122 Type 2 diabetes mellitus with diabetic chronic kidney disease: Secondary | ICD-10-CM | POA: Diagnosis not present

## 2019-07-26 DIAGNOSIS — N182 Chronic kidney disease, stage 2 (mild): Secondary | ICD-10-CM | POA: Diagnosis not present

## 2019-07-26 DIAGNOSIS — E7800 Pure hypercholesterolemia, unspecified: Secondary | ICD-10-CM

## 2019-07-26 NOTE — Progress Notes (Signed)
Chronic Care Management    Visit Note  07/26/2019 Name: Jean [name] MRN: [id number] DOB: [birth year]  Referred by: Glendale Chard, MD Reason for referral : Chronic Care Management   Jean [name] is a 78 y.o. year old female who is a primary care patient of Glendale Chard, MD. The CCM team was consulted for assistance with chronic disease management and care coordination needs related to HLD and DMII  Review of patient status, including review of consultants reports, relevant laboratory and other test results, and collaboration with appropriate care team members and the patient's provider was performed as part of comprehensive patient evaluation and provision of chronic care management services.    I spoke with Jean [name] by telephone today  Medications: Outpatient Encounter Medications as of 07/26/2019  Medication Sig  . amLODipine (NORVASC) 10 MG tablet TAKE 1 TABLET BY MOUTH  DAILY  . aspirin EC 81 MG tablet Take 81 mg by mouth daily.  . benazepril (LOTENSIN) 20 MG tablet TAKE 1 TABLET BY MOUTH  DAILY  . cholecalciferol (VITAMIN D) 1000 UNITS tablet Take 1,000 Units by mouth daily.  . cinacalcet (SENSIPAR) 30 MG tablet Take 30 mg by mouth daily.  [name] glucose blood test strip Use as instructed to check blood sugars 2 times per day dx: e11.22  . rosuvastatin (CRESTOR) 20 MG tablet TAKE 1 TABLET BY MOUTH  DAILY  . TRADJENTA 5 MG TABS tablet TAKE ONE TABLET BY MOUTH ONCE DAILY   No facility-administered encounter medications on file as of 07/26/2019.     Objective:   Goals Addressed            This Visit's Progress     Patient Stated   . COMPLETED: "I am having a hard time affording my medications" (pt-stated)       Current Barriers:  . Financial constraints  . Lacks knowledge of resources such as patient assistance   CCM PharmD Clinical Goal(s):  [name] Over the next 60 days, patient will work with [place] to address needs related to medication cost  (goal re-established  for 2021)  Interventions: . Discussed plans with patient for ongoing care management follow up and provided patient with direct contact information for care management team  . Patient agreeable to participate in patient assistance program with [employer] for [place] for 2021). Educated the patient on patient assistance programs available pending income, spending requirements, and drug manufacturer specifications.  . Application prepared and mailed to patient.  PCP portion to be routed.   Patient Self Care Activities:  . Self administers medications as prescribed . Attends all scheduled provider appointments . Calls pharmacy for medication refills  Please see past updates related to this goal by clicking on the "Past Updates" button in the selected goal      . I want to work on controling my diabetes (pt-stated)       Current Barriers:  . Diabetes: I6NG, complicated by chronic medical conditions including HTN, HLD, most recent A1c 6.9% . Current antihyperglycemic regimen: Tradjenta . Denies hypoglycemic symptoms, Denies hyperglycemic symptoms . Current meal patterns: o Breakfast: coffee, eggs/protein o Lunch: incorporates protein/veggie/light carb o Supper:incorporates protein/veggie/light carb o Snacks: encouraged veggie/protein/low carb o Drinks: avoids sugary drinks-mostly water, tea, coffee . Current exercise: walks . Current blood glucose readings: 110-120 . Cardiovascular risk reduction: o Current hypertensive regimen: amlodipine, benazepril o Current hyperlipidemia regimen: rosuvastatin (last filled 07/13/19 #90DS) o Current antiplatelet regimen: ASA 81  Pharmacist Clinical Goal(s):  [name] Over the  next 90 days, patient will work with PharmD and primary care provider to address needs related to optimization of medication management of chronic conditions  Interventions: . Comprehensive medication review performed, medication list updated in electronic medical  record.  No changes to current regimen.  Patient tolerating well.  She denies adverse events. . She reports 100% compliance with medication regimen . She states she is continuing to eat better and make healthier decisions.  She is encouraged by the improved labs and will continue to adhere to regimen & lifestyle.  Patient Self Care Activities:  . Patient will check blood glucose 3x weekly or as needed, document, and provide at future appointments . Patient will focus on medication adherence  . Patient will take medications as prescribed . Patient will contact provider with any episodes of hypoglycemia . Patient will report any questions or concerns to provider    Please see past updates related to this goal by clicking on the "Past Updates" button in the selected goal           Plan:   The care management team will reach out to the patient again over the next 30 days.     Provider Signature Kieth Brightly, PharmD, BCPS Clinical Pharmacist, [facility]  [facility]  Direct Dial: [phone]

## 2019-07-26 NOTE — Patient Instructions (Signed)
Visit Information  Goals Addressed            This Visit's Progress     Patient Stated   . COMPLETED: "I am having a hard time affording my medications" (pt-stated)       Current Barriers:  . Financial constraints  . Lacks knowledge of resources such as patient assistance   CCM PharmD Clinical Goal(s):  [name] Over the next 60 days, patient will work with [place] to address needs related to medication cost  (goal re-established for 2021)  Interventions: . Discussed plans with patient for ongoing care management follow up and provided patient with direct contact information for care management team  . Patient agreeable to participate in patient assistance program with [employer] for [place] for 2021). Educated the patient on patient assistance programs available pending income, spending requirements, and drug manufacturer specifications.  . Application prepared and mailed to patient.  PCP portion to be routed.   Patient Self Care Activities:  . Self administers medications as prescribed . Attends all scheduled provider appointments . Calls pharmacy for medication refills  Please see past updates related to this goal by clicking on the "Past Updates" button in the selected goal      . I want to work on controling my diabetes (pt-stated)       Current Barriers:  . Diabetes: V9DG, complicated by chronic medical conditions including HTN, HLD, most recent A1c 6.9% . Current antihyperglycemic regimen: Tradjenta . Denies hypoglycemic symptoms, Denies hyperglycemic symptoms . Current meal patterns: o Breakfast: coffee, eggs/protein o Lunch: incorporates protein/veggie/light carb o Supper:incorporates protein/veggie/light carb o Snacks: encouraged veggie/protein/low carb o Drinks: avoids sugary drinks-mostly water, tea, coffee . Current exercise: walks . Current blood glucose readings: 110-120 . Cardiovascular risk reduction: o Current hypertensive regimen: amlodipine,  benazepril o Current hyperlipidemia regimen: rosuvastatin (last filled 07/13/19 #90DS) o Current antiplatelet regimen: ASA 81  Pharmacist Clinical Goal(s):  [name] Over the next 90 days, patient will work with PharmD and primary care provider to address needs related to optimization of medication management of chronic conditions  Interventions: . Comprehensive medication review performed, medication list updated in electronic medical record.  No changes to current regimen.  Patient tolerating well.  She denies adverse events. . She reports 100% compliance with medication regimen . She states she is continuing to eat better and make healthier decisions.  She is encouraged by the improved labs and will continue to adhere to regimen & lifestyle.  Patient Self Care Activities:  . Patient will check blood glucose 3x weekly or as needed, document, and provide at future appointments . Patient will focus on medication adherence  . Patient will take medications as prescribed . Patient will contact provider with any episodes of hypoglycemia . Patient will report any questions or concerns to provider    Please see past updates related to this goal by clicking on the "Past Updates" button in the selected goal          The patient verbalized understanding of instructions provided today and declined a print copy of patient instruction materials.   The care management team will reach out to the patient again over the next 30 days.   SIGNATURE Regina Eck, PharmD, BCPS Clinical Pharmacist, [facility] [place]: [phone]

## 2019-07-26 NOTE — Progress Notes (Signed)
  Chronic Care Management   Outreach Note  07/21/2019 Name: Jean [name] MRN: [id number] DOB: Dec 22, 1940  Referred by: Glendale Chard, MD Reason for referral : Chronic Care Management   An unsuccessful telephone outreach was attempted today. The patient was referred to the case management team by for assistance with care management and care coordination.   Follow Up Plan: A HIPPA compliant phone message was left for the patient providing contact information and requesting a return call.  The care management team will reach out to the patient again over the next 7-10 days.   SIGNATURE Regina Eck, PharmD, BCPS Clinical Pharmacist, [facility] [place]: [phone]

## 2019-07-26 NOTE — Patient Outreach (Signed)
[facility]) Care Management  07/26/2019  AHYANA SKILLIN 1941-03-06 [id number]                                       Medication Assistance Referral  Referral From: [facility] Embedded RPh Jenne Pane.   Medication/Company: [name] / [employer] Patient application portion:  Mailed Provider application portion: Faxed  to Dr. Johnnye Lana Provider address/fax verified via: Office website   Follow up:  Will follow up with patient in 10-14 business days to confirm application(s) have been received.  Maud Deed Chana Bode [place] Certified Pharmacy Technician [place] [facility] Direct Dial:[phone]

## 2019-07-29 DIAGNOSIS — N183 Chronic kidney disease, stage 3 unspecified: Secondary | ICD-10-CM | POA: Diagnosis not present

## 2019-08-03 DIAGNOSIS — N183 Chronic kidney disease, stage 3 unspecified: Secondary | ICD-10-CM | POA: Diagnosis not present

## 2019-08-03 DIAGNOSIS — D631 Anemia in chronic kidney disease: Secondary | ICD-10-CM | POA: Diagnosis not present

## 2019-08-03 DIAGNOSIS — E213 Hyperparathyroidism, unspecified: Secondary | ICD-10-CM | POA: Diagnosis not present

## 2019-08-03 DIAGNOSIS — Q600 Renal agenesis, unilateral: Secondary | ICD-10-CM | POA: Diagnosis not present

## 2019-08-03 DIAGNOSIS — I129 Hypertensive chronic kidney disease with stage 1 through stage 4 chronic kidney disease, or unspecified chronic kidney disease: Secondary | ICD-10-CM | POA: Diagnosis not present

## 2019-08-05 ENCOUNTER — Other Ambulatory Visit: Payer: Self-pay | Admitting: Pharmacy Technician

## 2019-08-05 NOTE — Patient Outreach (Signed)
[facility]) Care Management  08/05/2019  Jean [name] 02/24/1941 [id number]   Received patient portion(s) of patient assistance application(s) for Tradjenta. Faxed completed application and required documents into [employer].  Will follow up with company(ies) in 7-10 business days to check status of application(s).  [name] Effie Shy CPhT Certified Pharmacy Technician [facility] Care Management Direct [phone]

## 2019-08-11 ENCOUNTER — Other Ambulatory Visit: Payer: Self-pay | Admitting: Pharmacy Technician

## 2019-08-11 NOTE — Patient Outreach (Signed)
[facility]) Care Management  08/11/2019  Jean [name] 1940-10-19 [id number]   Received approval confirmation sheet from [facility] Embedded RPh Kandra Nicolas.  Patient has been approved for Tradjenta thru [employer] until 12/31//2021.  Patient assistance has been completed, will remove myself from care team.  [name]. Effie Shy CPhT Certified Pharmacy Technician [facility] Care Management Direct Dial:[phone]

## 2019-08-13 ENCOUNTER — Telehealth: Payer: Self-pay

## 2019-08-15 ENCOUNTER — Other Ambulatory Visit: Payer: Self-pay | Admitting: Internal Medicine

## 2019-08-20 ENCOUNTER — Telehealth: Payer: Self-pay

## 2019-09-01 ENCOUNTER — Telehealth: Payer: Self-pay | Admitting: Pharmacist

## 2019-09-07 ENCOUNTER — Telehealth: Payer: Self-pay

## 2019-09-07 ENCOUNTER — Ambulatory Visit (INDEPENDENT_AMBULATORY_CARE_PROVIDER_SITE_OTHER): Payer: Medicare Other

## 2019-09-07 DIAGNOSIS — E1122 Type 2 diabetes mellitus with diabetic chronic kidney disease: Secondary | ICD-10-CM

## 2019-09-07 DIAGNOSIS — N182 Chronic kidney disease, stage 2 (mild): Secondary | ICD-10-CM

## 2019-09-07 DIAGNOSIS — I129 Hypertensive chronic kidney disease with stage 1 through stage 4 chronic kidney disease, or unspecified chronic kidney disease: Secondary | ICD-10-CM | POA: Diagnosis not present

## 2019-09-08 ENCOUNTER — Telehealth: Payer: Self-pay

## 2019-09-08 ENCOUNTER — Other Ambulatory Visit: Payer: Self-pay

## 2019-09-08 NOTE — Patient Instructions (Signed)
Visit Information  Goals Addressed      Patient Stated   . "I would like to learn more about Meal planning" (pt-stated)       Current Barriers:  [name] Knowledge Deficits related to diabetes disease process and Meal Planning . Chronic Disease Management support and education needs related to DMII, Hypertensive Nephropathy  Nurse Case Manager Clinical Goal(s):  [name] Over the next 30 days, patient will work with CCM RNCM to address needs related to diabetes disease process and treatment management with Meal Planning  Goal Met . 09/08/19 New Over the next 90 days, patient will be able to verbalize signs/symptoms of hypo/hyperglycemia and will have increased knowledge on how to Self manage these symptoms   CCM RN CM Interventions:   . Evaluation of current treatment plan related to diabetes and patient's adherence to plan as established by provider. . Provided education to patient re: target A1C obtained on 07/15/19 is down to 6.9; discussed target A1C is <7.0; Positive reinforcement given to patient for lowering her A1C below target; provided education related to exercise will increase demand for more glucose to help your muscles meet the demand  . Discussed plans with patient for ongoing care management follow up and provided patient with direct contact information for care management team . Provided patient with printed  educational materials related to Diabetes management with Meal Planning; Carb Counting; Diabetes Zone Management Tool; Life's Simple 7; 6 Tips to be Water Wise for Healthy Kidneys . Advised patient, providing education and rationale, to check cbg daily  and record, calling RNCM and or PCP provider for findings outside established parameters.  (80-130) . Determined patient has only experienced 1 low reading last week; BS dropped to 68, patient recognized symptoms of confusion and impaired vision to be related to low BS and chewed 1/2 glucose tablet, BS increased to normal range; Determined  patient may not have eaten enough breakfast and was staying active . Determined patient continues to stair climb in her home (14 stairs) about 12 times daily for exercise  Patient Self Care Activities:  . Self administers medications as prescribed . Attends all scheduled provider appointments . Calls pharmacy for medication refills . Performs ADL's independently . Performs IADL's independently . Calls provider office for new concerns or questions  Please see past updates related to this goal by clicking on the "Past Updates" button in the selected goal     . COMPLETED: I want to work on controling my diabetes (pt-stated)       Current Barriers:  . Diabetes: Q3ES, complicated by chronic medical conditions including HTN, HLD, most recent A1c 6.9% . Current antihyperglycemic regimen: Tradjenta . Denies hypoglycemic symptoms, Denies hyperglycemic symptoms . Current meal patterns: o Breakfast: coffee, eggs/protein o Lunch: incorporates protein/veggie/light carb o Supper:incorporates protein/veggie/light carb o Snacks: encouraged veggie/protein/low carb o Drinks: avoids sugary drinks-mostly water, tea, coffee . Current exercise: walks . Current blood glucose readings: 110-120 . Cardiovascular risk reduction: o Current hypertensive regimen: amlodipine, benazepril o Current hyperlipidemia regimen: rosuvastatin (last filled 07/13/19 #90DS) o Current antiplatelet regimen: ASA 81  Pharmacist Clinical Goal(s):  [name] Over the next 90 days, patient will work with PharmD and primary care provider to address needs related to optimization of medication management of chronic conditions  Interventions: . Comprehensive medication review performed, medication list updated in electronic medical record.  No changes to current regimen.  Patient tolerating well.  She denies adverse events. . She reports 100% compliance with medication regimen . She states  she is continuing to eat better and make healthier  decisions.  She is encouraged by the improved labs and will continue to adhere to regimen & lifestyle.  Patient Self Care Activities:  . Patient will check blood glucose 3x weekly or as needed, document, and provide at future appointments . Patient will focus on medication adherence  . Patient will take medications as prescribed . Patient will contact provider with any episodes of hypoglycemia . Patient will report any questions or concerns to provider    Please see past updates related to this goal by clicking on the "Past Updates" button in the selected goal       Other   . COMPLETED: Assist with Chronic Disease Management and Care Coordination Needs        Current Barriers:  [name] Knowledge Barriers related to resources and support available to address needs related to Chronic Disease Management and Care Coordination needs   Case Manager Clinical Goal(s):  [name] Over the next 30 days, patient will work with the CCM team to address needs related to Chronic disease management and [employer].   CCM RN CM Interventions:  Completed on 12/16/18:   . Collaborated with BSW and initiated plan of care to address needs related to [employer] and [employer] needs for transportation  Patient Self Care Activities:  . Attends all scheduled provider appointments . Calls pharmacy for medication refills . Calls provider office for new concerns or questions  Initial goal documentation       The patient verbalized understanding of instructions provided today and declined a print copy of patient instruction materials.   Telephone follow up appointment with care management team member scheduled for: 11/03/19  Barb Merino, RN, BSN, CCM Care Management Coordinator [place] [facility]  Direct Phone: [phone]

## 2019-09-08 NOTE — Chronic Care Management (AMB) (Signed)
Chronic Care Management   Follow Up Note   09/08/2019 Name: Jean [name] MRN: [id number] DOB: 1940/09/12  Referred by: Glendale Chard, MD Reason for referral : Chronic Care Management (CCM RNCM Telephone Follow up )   Jean [name] is a 79 y.o. year old female who is a primary care patient of Glendale Chard, MD. The CCM team was consulted for assistance with chronic disease management and care coordination needs.    Review of patient status, including review of consultants reports, relevant laboratory and other test results, and collaboration with appropriate care team members and the patient's provider was performed as part of comprehensive patient evaluation and provision of chronic care management services.    SDOH (Social Determinants of Health) screening performed today: None. See Care Plan for related entries.   Placed outbound CCM RN CM call to patient for a CCM follow up.   Outpatient Encounter Medications as of 09/07/2019  Medication Sig  . amLODipine (NORVASC) 10 MG tablet TAKE 1 TABLET BY MOUTH  DAILY  . aspirin EC 81 MG tablet Take 81 mg by mouth daily.  . benazepril (LOTENSIN) 20 MG tablet TAKE 1 TABLET BY MOUTH  DAILY  . cholecalciferol (VITAMIN D) 1000 UNITS tablet Take 1,000 Units by mouth daily.  . cinacalcet (SENSIPAR) 30 MG tablet Take 30 mg by mouth daily.  [name] glucose blood test strip Use as instructed to check blood sugars 2 times per day dx: e11.22  . rosuvastatin (CRESTOR) 20 MG tablet TAKE 1 TABLET BY MOUTH  DAILY  . TRADJENTA 5 MG TABS tablet TAKE ONE TABLET BY MOUTH ONCE DAILY   No facility-administered encounter medications on file as of 09/07/2019.     Objective:  Lab Results  Component Value Date   HGBA1C 6.9 (H) 07/15/2019   HGBA1C 6.9 (H) 03/17/2019   HGBA1C 7.5 (H) 12/09/2018   Lab Results  Component Value Date   MICROALBUR 150 12/09/2018   LDLCALC 83 03/17/2019   CREATININE 0.95 07/15/2019   BP Readings from Last 3 Encounters:  07/15/19  124/78  03/17/19 122/68  12/09/18 (!) 146/82    Goals Addressed      Patient Stated   . "I would like to learn more about Meal planning" (pt-stated)       Current Barriers:  [name] Knowledge Deficits related to diabetes disease process and Meal Planning . Chronic Disease Management support and education needs related to DMII, Hypertensive Nephropathy  Nurse Case Manager Clinical Goal(s):  [name] Over the next 30 days, patient will work with CCM RNCM to address needs related to diabetes disease process and treatment management with Meal Planning  Goal Met . 09/08/19 New Over the next 90 days, patient will be able to verbalize signs/symptoms of hypo/hyperglycemia and will have increased knowledge on how to Self manage these symptoms   CCM RN CM Interventions:   . Evaluation of current treatment plan related to diabetes and patient's adherence to plan as established by provider. . Provided education to patient re: target A1C obtained on 07/15/19 is down to 6.9; discussed target A1C is <7.0; Positive reinforcement given to patient for lowering her A1C below target; provided education related to exercise will increase demand for more glucose to help your muscles meet the demand  . Discussed plans with patient for ongoing care management follow up and provided patient with direct contact information for care management team . Provided patient with printed  educational materials related to Diabetes management with Meal Planning; Carb Counting; Diabetes  Zone Management Tool; Life's Simple 7; 6 Tips to be Water Wise for Healthy Kidneys . Advised patient, providing education and rationale, to check cbg daily  and record, calling RNCM and or PCP provider for findings outside established parameters.  (80-130) . Determined patient has only experienced 1 low reading last week; BS dropped to 68, patient recognized symptoms of confusion and impaired vision to be related to low BS and chewed 1/2 glucose tablet, BS  increased to normal range; Determined patient may not have eaten enough breakfast and was staying active . Determined patient continues to stair climb in her home (14 stairs) about 12 times daily for exercise  Patient Self Care Activities:  . Self administers medications as prescribed . Attends all scheduled provider appointments . Calls pharmacy for medication refills . Performs ADL's independently . Performs IADL's independently . Calls provider office for new concerns or questions  Please see past updates related to this goal by clicking on the "Past Updates" button in the selected goal     . COMPLETED: I want to work on controling my diabetes (pt-stated)       Current Barriers:  . Diabetes: W8GS, complicated by chronic medical conditions including HTN, HLD, most recent A1c 6.9% . Current antihyperglycemic regimen: Tradjenta . Denies hypoglycemic symptoms, Denies hyperglycemic symptoms . Current meal patterns: o Breakfast: coffee, eggs/protein o Lunch: incorporates protein/veggie/light carb o Supper:incorporates protein/veggie/light carb o Snacks: encouraged veggie/protein/low carb o Drinks: avoids sugary drinks-mostly water, tea, coffee . Current exercise: walks . Current blood glucose readings: 110-120 . Cardiovascular risk reduction: o Current hypertensive regimen: amlodipine, benazepril o Current hyperlipidemia regimen: rosuvastatin (last filled 07/13/19 #90DS) o Current antiplatelet regimen: ASA 81  Pharmacist Clinical Goal(s):  [name] Over the next 90 days, patient will work with PharmD and primary care provider to address needs related to optimization of medication management of chronic conditions  Interventions: . Comprehensive medication review performed, medication list updated in electronic medical record.  No changes to current regimen.  Patient tolerating well.  She denies adverse events. . She reports 100% compliance with medication regimen . She states she is  continuing to eat better and make healthier decisions.  She is encouraged by the improved labs and will continue to adhere to regimen & lifestyle.  Patient Self Care Activities:  . Patient will check blood glucose 3x weekly or as needed, document, and provide at future appointments . Patient will focus on medication adherence  . Patient will take medications as prescribed . Patient will contact provider with any episodes of hypoglycemia . Patient will report any questions or concerns to provider   Please see past updates related to this goal by clicking on the "Past Updates" button in the selected goal       Other   . COMPLETED: Assist with Chronic Disease Management and Care Coordination Needs        Current Barriers:  [name] Knowledge Barriers related to resources and support available to address needs related to Chronic Disease Management and Care Coordination needs   Case Manager Clinical Goal(s):  [name] Over the next 30 days, patient will work with the CCM team to address needs related to Chronic disease management and [employer].   CCM RN CM Interventions:  Completed on 12/16/18:   . Collaborated with BSW and initiated plan of care to address needs related to [employer] and [employer] needs for transportation  Patient Self Care Activities:  . Attends all scheduled provider appointments . Calls pharmacy for  medication refills . Calls provider office for new concerns or questions  Initial goal documentation        Plan:   Telephone follow up appointment with care management team member scheduled for: 11/03/19  Barb Merino, RN, BSN, CCM Care Management Coordinator [facility]  Direct Phone: [phone]

## 2019-09-13 ENCOUNTER — Other Ambulatory Visit: Payer: Self-pay | Admitting: Internal Medicine

## 2019-09-24 DIAGNOSIS — E119 Type 2 diabetes mellitus without complications: Secondary | ICD-10-CM | POA: Diagnosis not present

## 2019-09-24 LAB — HM DIABETES EYE EXAM

## 2019-09-27 ENCOUNTER — Ambulatory Visit: Payer: Medicare Other | Attending: Internal Medicine

## 2019-09-27 DIAGNOSIS — Z23 Encounter for immunization: Secondary | ICD-10-CM

## 2019-09-27 NOTE — Progress Notes (Signed)
   Covid-19 Vaccination Clinic  Name:  Jean [name]    MRN: [id number] DOB: [birth year]  09/27/2019  Jean [name] was observed post Covid-19 immunization for 15 minutes without incidence. She was provided with Vaccine Information Sheet and instruction to access the V-Safe system.   Jean [name] was instructed to call 911 with any severe reactions post vaccine: [name] Difficulty breathing  . Swelling of your face and throat  . A fast heartbeat  . A bad rash all over your body  . Dizziness and weakness    Immunizations Administered    Name Date Dose VIS Date Route   Pfizer COVID-19 Vaccine 09/27/2019 12:00 PM 0.3 mL 07/09/2019 Intramuscular   Manufacturer: [employer], [employer]   Lot: [id number]   NDC: [address]-6

## 2019-10-11 ENCOUNTER — Encounter: Payer: Self-pay | Admitting: Internal Medicine

## 2019-10-26 ENCOUNTER — Ambulatory Visit: Payer: Medicare Other | Attending: Internal Medicine

## 2019-10-26 DIAGNOSIS — Z23 Encounter for immunization: Secondary | ICD-10-CM

## 2019-10-26 NOTE — Progress Notes (Signed)
   Covid-19 Vaccination Clinic  Name:  PARNIKA TWETEN    MRN: [id number] DOB: 1941/05/13  10/26/2019  Ms. Placide was observed post Covid-19 immunization for 30 minutes based on pre-vaccination screening without incident. She was provided with Vaccine Information Sheet and instruction to access the V-Safe system.   Ms. Bangura was instructed to call 911 with any severe reactions post vaccine: [name] Difficulty breathing  . Swelling of face and throat  . A fast heartbeat  . A bad rash all over body  . Dizziness and weakness   Immunizations Administered    Name Date Dose VIS Date Route   Pfizer COVID-19 Vaccine 10/26/2019 12:13 PM 0.3 mL 07/09/2019 Intramuscular   Manufacturer: [employer], [employer]   Lot: [id number]   NDC: [address]-0

## 2019-11-03 ENCOUNTER — Telehealth: Payer: Self-pay

## 2019-11-18 ENCOUNTER — Telehealth: Payer: Self-pay

## 2019-11-26 ENCOUNTER — Telehealth: Payer: Self-pay

## 2019-12-02 ENCOUNTER — Ambulatory Visit: Payer: Medicare Other

## 2019-12-13 ENCOUNTER — Encounter: Payer: Medicare Other | Admitting: Internal Medicine

## 2019-12-16 ENCOUNTER — Ambulatory Visit (INDEPENDENT_AMBULATORY_CARE_PROVIDER_SITE_OTHER): Payer: Medicare Other | Admitting: Internal Medicine

## 2019-12-16 ENCOUNTER — Encounter: Payer: Self-pay | Admitting: Internal Medicine

## 2019-12-16 ENCOUNTER — Other Ambulatory Visit: Payer: Self-pay

## 2019-12-16 ENCOUNTER — Ambulatory Visit (INDEPENDENT_AMBULATORY_CARE_PROVIDER_SITE_OTHER): Payer: Medicare Other

## 2019-12-16 VITALS — BP 138/70 | HR 108 | Temp 98.2°F | Ht 65.0 in | Wt 157.8 lb

## 2019-12-16 DIAGNOSIS — I129 Hypertensive chronic kidney disease with stage 1 through stage 4 chronic kidney disease, or unspecified chronic kidney disease: Secondary | ICD-10-CM | POA: Diagnosis not present

## 2019-12-16 DIAGNOSIS — E1122 Type 2 diabetes mellitus with diabetic chronic kidney disease: Secondary | ICD-10-CM

## 2019-12-16 DIAGNOSIS — N182 Chronic kidney disease, stage 2 (mild): Secondary | ICD-10-CM

## 2019-12-16 DIAGNOSIS — Z0000 Encounter for general adult medical examination without abnormal findings: Secondary | ICD-10-CM

## 2019-12-16 LAB — POCT URINALYSIS DIPSTICK
Bilirubin, UA: NEGATIVE
Blood, UA: NEGATIVE
Glucose, UA: NEGATIVE
Ketones, UA: NEGATIVE
Leukocytes, UA: NEGATIVE
Nitrite, UA: NEGATIVE
Protein, UA: NEGATIVE
Spec Grav, UA: 1.015 (ref 1.010–1.025)
Urobilinogen, UA: 0.2 E.U./dL
pH, UA: 6 (ref 5.0–8.0)

## 2019-12-16 LAB — POCT UA - MICROALBUMIN
Albumin/Creatinine Ratio, Urine, POC: <30
Creatinine, POC: 50 mg/dL
Microalbumin Ur, POC: 10 mg/L

## 2019-12-16 NOTE — Patient Instructions (Signed)
Health Maintenance, Female Adopting a healthy lifestyle and getting preventive care are important in promoting health and wellness. Ask your health care provider about:  The right schedule for you to have regular tests and exams.  Things you can do on your own to prevent diseases and keep yourself healthy. What should I know about diet, weight, and exercise? Eat a healthy diet   Eat a diet that includes plenty of vegetables, fruits, low-fat dairy products, and lean protein.  Do not eat a lot of foods that are high in solid fats, added sugars, or sodium. Maintain a healthy weight Body mass index (BMI) is used to identify weight problems. It estimates body fat based on height and weight. Your health care provider can help determine your BMI and help you achieve or maintain a healthy weight. Get regular exercise Get regular exercise. This is one of the most important things you can do for your health. Most adults should:  Exercise for at least 150 minutes each week. The exercise should increase your heart rate and make you sweat (moderate-intensity exercise).  Do strengthening exercises at least twice a week. This is in addition to the moderate-intensity exercise.  Spend less time sitting. Even light physical activity can be beneficial. Watch cholesterol and blood lipids Have your blood tested for lipids and cholesterol at 80 years of age, then have this test every 5 years. Have your cholesterol levels checked more often if:  Your lipid or cholesterol levels are high.  You are older than 80 years of age.  You are at high risk for heart disease. What should I know about cancer screening? Depending on your health history and family history, you may need to have cancer screening at various ages. This may include screening for:  Breast cancer.  Cervical cancer.  Colorectal cancer.  Skin cancer.  Lung cancer. What should I know about heart disease, diabetes, and high blood  pressure? Blood pressure and heart disease  High blood pressure causes heart disease and increases the risk of stroke. This is more likely to develop in people who have high blood pressure readings, are of African descent, or are overweight.  Have your blood pressure checked: ? Every 3-5 years if you are 18-39 years of age. ? Every year if you are 40 years old or older. Diabetes Have regular diabetes screenings. This checks your fasting blood sugar level. Have the screening done:  Once every three years after age 40 if you are at a normal weight and have a low risk for diabetes.  More often and at a younger age if you are overweight or have a high risk for diabetes. What should I know about preventing infection? Hepatitis B If you have a higher risk for hepatitis B, you should be screened for this virus. Talk with your health care provider to find out if you are at risk for hepatitis B infection. Hepatitis C Testing is recommended for:  Everyone born from 1945 through 1965.  Anyone with known risk factors for hepatitis C. Sexually transmitted infections (STIs)  Get screened for STIs, including gonorrhea and chlamydia, if: ? You are sexually active and are younger than 80 years of age. ? You are older than 80 years of age and your health care provider tells you that you are at risk for this type of infection. ? Your sexual activity has changed since you were last screened, and you are at increased risk for chlamydia or gonorrhea. Ask your health care provider if   you are at risk.  Ask your health care provider about whether you are at high risk for HIV. Your health care provider may recommend a prescription medicine to help prevent HIV infection. If you choose to take medicine to prevent HIV, you should first get tested for HIV. You should then be tested every 3 months for as long as you are taking the medicine. Pregnancy  If you are about to stop having your period (premenopausal) and  you may become pregnant, seek counseling before you get pregnant.  Take 400 to 800 micrograms (mcg) of folic acid every day if you become pregnant.  Ask for birth control (contraception) if you want to prevent pregnancy. Osteoporosis and menopause Osteoporosis is a disease in which the bones lose minerals and strength with aging. This can result in bone fractures. If you are 65 years old or older, or if you are at risk for osteoporosis and fractures, ask your health care provider if you should:  Be screened for bone loss.  Take a calcium or vitamin D supplement to lower your risk of fractures.  Be given hormone replacement therapy (HRT) to treat symptoms of menopause. Follow these instructions at home: Lifestyle  Do not use any products that contain nicotine or tobacco, such as cigarettes, e-cigarettes, and chewing tobacco. If you need help quitting, ask your health care provider.  Do not use street drugs.  Do not share needles.  Ask your health care provider for help if you need support or information about quitting drugs. Alcohol use  Do not drink alcohol if: ? Your health care provider tells you not to drink. ? You are pregnant, may be pregnant, or are planning to become pregnant.  If you drink alcohol: ? Limit how much you use to 0-1 drink a day. ? Limit intake if you are breastfeeding.  Be aware of how much alcohol is in your drink. In the [place] one drink equals one 12 oz bottle of beer (355 mL), one 5 oz glass of wine (148 mL), or one 1 oz glass of hard liquor (44 mL). General instructions  Schedule regular health, dental, and eye exams.  Stay current with your vaccines.  Tell your health care provider if: ? You often feel depressed. ? You have ever been abused or do not feel safe at home. Summary  Adopting a healthy lifestyle and getting preventive care are important in promoting health and wellness.  Follow your health care provider's instructions about healthy  diet, exercising, and getting tested or screened for diseases.  Follow your health care provider's instructions on monitoring your cholesterol and blood pressure. This information is not intended to replace advice given to you by your health care provider. Make sure you discuss any questions you have with your health care provider. Document Revised: 07/08/2018 Document Reviewed: 07/08/2018 Elsevier Patient Education  2020 [employer]  

## 2019-12-16 NOTE — Progress Notes (Signed)
This visit occurred during the SARS-CoV-2 public health emergency.  Safety protocols were in place, including screening questions prior to the visit, additional usage of staff PPE, and extensive cleaning of exam room while observing appropriate contact time as indicated for disinfecting solutions.  Subjective:   Jean [name] is a 78 y.o. female who presents for Medicare Annual (Subsequent) preventive examination.  Review of Systems:  n/a Cardiac Risk Factors include: advanced age (>45mn, >>22women);diabetes mellitus;hypertension     Objective:     Vitals: BP 138/70 (BP Location: Left Arm, Patient Position: Sitting, Cuff Size: Normal)   Pulse (!) 108   Temp 98.2 F (36.8 C) (Oral)   Ht 5' 5"  (1.651 m)   Wt 157 lb 12.8 oz (71.6 kg)   SpO2 97%   BMI 26.26 kg/m   Body mass index is 26.26 kg/m.  Advanced Directives 12/16/2019 12/16/2018 12/01/2018  Does Patient Have a Medical Advance Directive? No No No  Would patient like information on creating a medical advance directive? No - Patient declined Yes (MAU/Ambulatory/Procedural Areas - Information given) -    Tobacco Social History   Tobacco Use  Smoking Status Never Smoker  Smokeless Tobacco Never Used     Counseling given: Not Answered   Clinical Intake:  Pre-visit preparation completed: Yes  Pain : No/denies pain     Nutritional Status: BMI 25 -29 Overweight Nutritional Risks: None Diabetes: Yes  How often do you need to have someone help you when you read instructions, pamphlets, or other written materials from your doctor or pharmacy?: 1 - Never What is the last grade level you completed in school?: 12th grade  Interpreter Needed?: No  Information entered by :: NAllen LPN  Past Medical History:  Diagnosis Date  . Blood transfusion without reported diagnosis    No reaction   . [place]   . Hypertension    Past Surgical History:  Procedure Laterality Date  .  ABDOMINAL HYSTERECTOMY    . BACK SURGERY    . BREAST EXCISIONAL BIOPSY    . BREAST SURGERY Bilateral    lumpectomy  . NEPHRECTOMY Right   . WRIST SURGERY     rt   Family History  Problem Relation Age of Onset  . Breast cancer Sister   . Breast cancer Sister   . Healthy Mother   . Healthy Father    Social History   Socioeconomic History  . Marital status: Married    Spouse name: Not on file  . Number of children: Not on file  . Years of education: Not on file  . Highest education level: Not on file  Occupational History  . Occupation: retired  Tobacco Use  . Smoking status: Never Smoker  . Smokeless tobacco: Never Used  Substance and Sexual Activity  . Alcohol use: No  . Drug use: No  . Sexual activity: Not Currently  Other Topics Concern  . Not on file  Social History Narrative  . Not on file   Social Determinants of Health   Financial Resource Strain: Low Risk   . Difficulty of Paying Living Expenses: Not hard at all  Food Insecurity: No Food Insecurity  . Worried About RCharity fundraiserin the Last Year: Never true  . Ran Out of Food in the Last Year: Never true  Transportation Needs: No Transportation Needs  . Lack of Transportation (Medical): No  . Lack of Transportation (Non-Medical): No  Physical Activity: Insufficiently Active  .  Days of Exercise per Week: 7 days  . Minutes of Exercise per Session: 20 min  Stress: No Stress Concern Present  . Feeling of Stress : Not at all  Social Connections:   . Frequency of Communication with Friends and Family:   . Frequency of Social Gatherings with Friends and Family:   . Attends Religious Services:   . Active Member of Clubs or Organizations:   . Attends Archivist Meetings:   [name] Marital Status:     Outpatient Encounter Medications as of 12/16/2019  Medication Sig  . amLODipine (NORVASC) 10 MG tablet TAKE 1 TABLET BY MOUTH  DAILY  . aspirin EC 81 MG tablet Take 81 mg by mouth daily.  .  benazepril (LOTENSIN) 20 MG tablet TAKE 1 TABLET BY MOUTH  DAILY  . cholecalciferol (VITAMIN D) 1000 UNITS tablet Take 1,000 Units by mouth daily.  . cinacalcet (SENSIPAR) 30 MG tablet Take 30 mg by mouth daily.  [name] glucose blood test strip Use as instructed to check blood sugars 2 times per day dx: e11.22  . rosuvastatin (CRESTOR) 20 MG tablet TAKE 1 TABLET BY MOUTH  DAILY  . TRADJENTA 5 MG TABS tablet TAKE ONE TABLET BY MOUTH ONCE DAILY   No facility-administered encounter medications on file as of 12/16/2019.    Activities of Daily Living In your present state of health, do you have any difficulty performing the following activities: 12/16/2019  Hearing? N  Vision? N  Difficulty concentrating or making decisions? N  Walking or climbing stairs? N  Dressing or bathing? N  Doing errands, shopping? N  Preparing Food and eating ? N  Using the Toilet? N  In the past six months, have you accidently leaked urine? N  Do you have problems with loss of bowel control? N  Managing your Medications? N  Managing your Finances? N  Housekeeping or managing your Housekeeping? N  Some recent data might be hidden    Patient Care Team: Glendale Chard, MD as PCP - General (Internal Medicine) Daneen Schick as Social Worker [place], Claudette Stapler, RN as Case Manager    Assessment:   This is a routine wellness examination for [name].  Exercise Activities and Dietary recommendations Current Exercise Habits: Home exercise routine, Type of exercise: Other - see comments(goes up and down stairs), Time (Minutes): 15, Frequency (Times/Week): 7, Weekly Exercise (Minutes/Week): 105  Goals    . "I would like to learn more about Meal planning" (pt-stated)     Current Barriers:  [name] Knowledge Deficits related to diabetes disease process and Meal Planning . Chronic Disease Management support and education needs related to DMII, Hypertensive Nephropathy  Nurse Case Manager Clinical Goal(s):  [name] Over the next 30  days, patient will work with CCM RNCM to address needs related to diabetes disease process and treatment management with Meal Planning  Goal Met . 09/08/19 New Over the next 90 days, patient will be able to verbalize signs/symptoms of hypo/hyperglycemia and will have increased knowledge on how to Self manage these symptoms   CCM RN CM Interventions:   . Evaluation of current treatment plan related to diabetes and patient's adherence to plan as established by provider. . Provided education to patient re: target A1C obtained on 07/15/19 is down to 6.9; discussed target A1C is <7.0; Positive reinforcement given to patient for lowering her A1C below target; provided education related to exercise will increase demand for more glucose to help your muscles meet the demand  . Discussed plans  with patient for ongoing care management follow up and provided patient with direct contact information for care management team . Provided patient with printed  educational materials related to Diabetes management with Meal Planning; Carb Counting; Diabetes Zone Management Tool; Life's Simple 7; 6 Tips to be Water Wise for Healthy Kidneys . Advised patient, providing education and rationale, to check cbg daily  and record, calling RNCM and or PCP provider for findings outside established parameters.  (80-130) . Determined patient has only experienced 1 low reading last week; BS dropped to 68, patient recognized symptoms of confusion and impaired vision to be related to low BS and chewed 1/2 glucose tablet, BS increased to normal range; Determined patient may not have eaten enough breakfast and was staying active . Determined patient continues to stair climb in her home (14 stairs) about 12 times daily for exercise  Patient Self Care Activities:  . Self administers medications as prescribed . Attends all scheduled provider appointments . Calls pharmacy for medication refills . Performs ADL's independently . Performs  IADL's independently . Calls provider office for new concerns or questions  Please see past updates related to this goal by clicking on the "Past Updates" button in the selected goal       . Patient Stated     Stay the same    . Patient Stated     12/16/2019, no goals       Fall Risk Fall Risk  12/16/2019 07/15/2019 03/17/2019 12/09/2018 12/01/2018  Falls in the past year? 0 0 0 0 0  Risk for fall due to : Medication side effect - - - Medication side effect  Follow up Falls evaluation completed;Education provided;Falls prevention discussed - - - Falls prevention discussed   Is the patient's home free of loose throw rugs in walkways, pet beds, electrical cords, etc?   yes      Grab bars in the bathroom? no      Handrails on the stairs?   yes      Adequate lighting?   yes  Timed Get Up and Go performed: n/a  Depression Screen PHQ 2/9 Scores 12/16/2019 03/17/2019 12/16/2018 12/01/2018  PHQ - 2 Score 0 0 0 0  PHQ- 9 Score 0 - - 0     Cognitive Function     6CIT Screen 12/16/2019 12/01/2018  What Year? 0 points 0 points  What month? 0 points -  What time? 0 points 0 points  Count back from 20 0 points 0 points  Months in reverse 0 points 0 points  Repeat phrase 2 points 0 points  Total Score 2 -    Immunization History  Administered Date(s) Administered  . Influenza, High Dose Seasonal PF 03/17/2019  . Influenza-Unspecified 04/28/2014  . PFIZER SARS-COV-2 Vaccination 09/27/2019, 10/26/2019  . Pneumococcal Polysaccharide-23 07/15/2019  . Tdap 07/16/2019    Qualifies for Shingles Vaccine? yes  Screening Tests Health Maintenance  Topic Date Due  . FOOT EXAM  12/09/2019  . HEMOGLOBIN A1C  01/13/2020  . INFLUENZA VACCINE  02/27/2020  . OPHTHALMOLOGY EXAM  09/23/2020  . TETANUS/TDAP  07/15/2029  . DEXA SCAN  Completed  . COVID-19 Vaccine  Completed  . PNA vac Low Risk Adult  Completed    Cancer Screenings: Lung: Low Dose CT Chest recommended if Age 61-80 years, 30  pack-year currently smoking OR have quit w/in 15years. Patient does not qualify. Breast:  Up to date on Mammogram? Yes   Up to date of Bone Density/Dexa? Yes Colorectal:  not required  Additional Screenings: : Hepatitis C Screening: n/a     Plan:    Patient has no goals set at this time.   I have personally reviewed and noted the following in the patient's chart:   . Medical and social history . Use of alcohol, tobacco or illicit drugs  . Current medications and supplements . Functional ability and status . Nutritional status . Physical activity . Advanced directives . List of other physicians . Hospitalizations, surgeries, and ER visits in previous 12 months . Vitals . Screenings to include cognitive, depression, and falls . Referrals and appointments  In addition, I have reviewed and discussed with patient certain preventive protocols, quality metrics, and best practice recommendations. A written personalized care plan for preventive services as well as general preventive health recommendations were provided to patient.     Kellie Simmering, LPN  5/50/1586

## 2019-12-16 NOTE — Patient Instructions (Signed)
Jean [name] , Thank you for taking time to come for your Medicare Wellness Visit. I appreciate your ongoing commitment to your health goals. Please review the following plan we discussed and let me know if I can assist you in the future.   Screening recommendations/referrals: Colonoscopy: not required Mammogram: not required Bone Density: 10/2017 Recommended yearly ophthalmology/optometry visit for glaucoma screening and checkup Recommended yearly dental visit for hygiene and checkup  Vaccinations: Influenza vaccine: 02/2019 Pneumococcal vaccine: 06/2019 Tdap vaccine: 06/2019 Shingles vaccine: discussed    Advanced directives: Advance directive discussed with you today. Even though you declined this today please call our office should you change your mind and we can give you the proper paperwork for you to fill out.  Conditions/risks identified: overweight  Next appointment: 12/14/2020 at 11:00   Preventive Care 65 Years and Older, Female Preventive care refers to lifestyle choices and visits with your health care provider that can promote health and wellness. What does preventive care include?  A yearly physical exam. This is also called an annual well check.  Dental exams once or twice a year.  Routine eye exams. Ask your health care provider how often you should have your eyes checked.  Personal lifestyle choices, including:  Daily care of your teeth and gums.  Regular physical activity.  Eating a healthy diet.  Avoiding tobacco and drug use.  Limiting alcohol use.  Practicing safe sex.  Taking low-dose aspirin every day.  Taking vitamin and mineral supplements as recommended by your health care provider. What happens during an annual well check? The services and screenings done by your health care provider during your annual well check will depend on your age, overall health, lifestyle risk factors, and family history of disease. Counseling  Your health care  provider may ask you questions about your:  Alcohol use.  Tobacco use.  Drug use.  Emotional well-being.  Home and relationship well-being.  Sexual activity.  Eating habits.  History of falls.  Memory and ability to understand (cognition).  Work and work Astronomer.  Reproductive health. Screening  You may have the following tests or measurements:  Height, weight, and BMI.  Blood pressure.  Lipid and cholesterol levels. These may be checked every 5 years, or more frequently if you are over 68 years old.  Skin check.  Lung cancer screening. You may have this screening every year starting at age 34 if you have a 30-pack-year history of smoking and currently smoke or have quit within the past 15 years.  Fecal occult blood test (FOBT) of the stool. You may have this test every year starting at age 45.  Flexible sigmoidoscopy or colonoscopy. You may have a sigmoidoscopy every 5 years or a colonoscopy every 10 years starting at age 65.  Hepatitis C blood test.  Hepatitis B blood test.  Sexually transmitted disease (STD) testing.  Diabetes screening. This is done by checking your blood sugar (glucose) after you have not eaten for a while (fasting). You may have this done every 1-3 years.  Bone density scan. This is done to screen for osteoporosis. You may have this done starting at age 16.  Mammogram. This may be done every 1-2 years. Talk to your health care provider about how often you should have regular mammograms. Talk with your health care provider about your test results, treatment options, and if necessary, the need for more tests. Vaccines  Your health care provider may recommend certain vaccines, such as:  Influenza vaccine. This is recommended  every year.  Tetanus, diphtheria, and acellular pertussis (Tdap, Td) vaccine. You may need a Td booster every 10 years.  Zoster vaccine. You may need this after age 19.  Pneumococcal 13-valent conjugate (PCV13)  vaccine. One dose is recommended after age 57.  Pneumococcal polysaccharide (PPSV23) vaccine. One dose is recommended after age 23. Talk to your health care provider about which screenings and vaccines you need and how often you need them. This information is not intended to replace advice given to you by your health care provider. Make sure you discuss any questions you have with your health care provider. Document Released: 08/11/2015 Document Revised: 04/03/2016 Document Reviewed: 05/16/2015 Elsevier Interactive Patient Education  2017 [place] Prevention in the Home Falls can cause injuries. They can happen to people of all ages. There are many things you can do to make your home safe and to help prevent falls. What can I do on the outside of my home?  Regularly fix the edges of walkways and driveways and fix any cracks.  Remove anything that might make you trip as you walk through a door, such as a raised step or threshold.  Trim any bushes or trees on the path to your home.  Use bright outdoor lighting.  Clear any walking paths of anything that might make someone trip, such as rocks or tools.  Regularly check to see if handrails are loose or broken. Make sure that both sides of any steps have handrails.  Any raised decks and porches should have guardrails on the edges.  Have any leaves, snow, or ice cleared regularly.  Use sand or salt on walking paths during winter.  Clean up any spills in your garage right away. This includes oil or grease spills. What can I do in the bathroom?  Use night lights.  Install grab bars by the toilet and in the tub and shower. Do not use towel bars as grab bars.  Use non-skid mats or decals in the tub or shower.  If you need to sit down in the shower, use a plastic, non-slip stool.  Keep the floor dry. Clean up any water that spills on the floor as soon as it happens.  Remove soap buildup in the tub or shower  regularly.  Attach bath mats securely with double-sided non-slip rug tape.  Do not have throw rugs and other things on the floor that can make you trip. What can I do in the bedroom?  Use night lights.  Make sure that you have a light by your bed that is easy to reach.  Do not use any sheets or blankets that are too big for your bed. They should not hang down onto the floor.  Have a firm chair that has side arms. You can use this for support while you get dressed.  Do not have throw rugs and other things on the floor that can make you trip. What can I do in the kitchen?  Clean up any spills right away.  Avoid walking on wet floors.  Keep items that you use a lot in easy-to-reach places.  If you need to reach something above you, use a strong step stool that has a grab bar.  Keep electrical cords out of the way.  Do not use floor polish or wax that makes floors slippery. If you must use wax, use non-skid floor wax.  Do not have throw rugs and other things on the floor that can make you trip. What  can I do with my stairs?  Do not leave any items on the stairs.  Make sure that there are handrails on both sides of the stairs and use them. Fix handrails that are broken or loose. Make sure that handrails are as long as the stairways.  Check any carpeting to make sure that it is firmly attached to the stairs. Fix any carpet that is loose or worn.  Avoid having throw rugs at the top or bottom of the stairs. If you do have throw rugs, attach them to the floor with carpet tape.  Make sure that you have a light switch at the top of the stairs and the bottom of the stairs. If you do not have them, ask someone to add them for you. What else can I do to help prevent falls?  Wear shoes that:  Do not have high heels.  Have rubber bottoms.  Are comfortable and fit you well.  Are closed at the toe. Do not wear sandals.  If you use a stepladder:  Make sure that it is fully  opened. Do not climb a closed stepladder.  Make sure that both sides of the stepladder are locked into place.  Ask someone to hold it for you, if possible.  Clearly mark and make sure that you can see:  Any grab bars or handrails.  First and last steps.  Where the edge of each step is.  Use tools that help you move around (mobility aids) if they are needed. These include:  Canes.  Walkers.  Scooters.  Crutches.  Turn on the lights when you go into a dark area. Replace any light bulbs as soon as they burn out.  Set up your furniture so you have a clear path. Avoid moving your furniture around.  If any of your floors are uneven, fix them.  If there are any pets around you, be aware of where they are.  Review your medicines with your doctor. Some medicines can make you feel dizzy. This can increase your chance of falling. Ask your doctor what other things that you can do to help prevent falls. This information is not intended to replace advice given to you by your health care provider. Make sure you discuss any questions you have with your health care provider. Document Released: 05/11/2009 Document Revised: 12/21/2015 Document Reviewed: 08/19/2014 Elsevier Interactive Patient Education  2017 [employer].

## 2019-12-17 LAB — CMP14+EGFR
ALT: 22 IU/L (ref 0–32)
AST: 18 IU/L (ref 0–40)
Albumin/Globulin Ratio: 1.3 (ref 1.2–2.2)
Albumin: 4.8 g/dL — ABNORMAL HIGH (ref 3.7–4.7)
Alkaline Phosphatase: 143 IU/L — ABNORMAL HIGH (ref 48–121)
BUN/Creatinine Ratio: 14 (ref 12–28)
BUN: 15 mg/dL (ref 8–27)
Bilirubin Total: 0.4 mg/dL (ref 0.0–1.2)
CO2: 20 mmol/L (ref 20–29)
Calcium: 10.2 mg/dL (ref 8.7–10.3)
Chloride: 101 mmol/L (ref 96–106)
Creatinine, Ser: 1.11 mg/dL — ABNORMAL HIGH (ref 0.57–1.00)
GFR calc Af Amer: 55 mL/min/{1.73_m2} — ABNORMAL LOW (ref >59)
GFR calc non Af Amer: 47 mL/min/{1.73_m2} — ABNORMAL LOW (ref >59)
Globulin, Total: 3.7 g/dL (ref 1.5–4.5)
Glucose: 68 mg/dL (ref 65–99)
Potassium: 4.4 mmol/L (ref 3.5–5.2)
Sodium: 141 mmol/L (ref 134–144)
Total Protein: 8.5 g/dL (ref 6.0–8.5)

## 2019-12-17 LAB — CBC
Hematocrit: 41.4 % (ref 34.0–46.6)
Hemoglobin: 13.4 g/dL (ref 11.1–15.9)
MCH: 27.5 pg (ref 26.6–33.0)
MCHC: 32.4 g/dL (ref 31.5–35.7)
MCV: 85 fL (ref 79–97)
Platelets: 267 10*3/uL (ref 150–450)
RBC: 4.87 x10E6/uL (ref 3.77–5.28)
RDW: 14.1 % (ref 11.7–15.4)
WBC: 8.7 10*3/uL (ref 3.4–10.8)

## 2019-12-17 LAB — LIPID PANEL
Chol/HDL Ratio: 3 ratio (ref 0.0–4.4)
Cholesterol, Total: 173 mg/dL (ref 100–199)
HDL: 58 mg/dL (ref >39)
LDL Chol Calc (NIH): 87 mg/dL (ref 0–99)
Triglycerides: 167 mg/dL — ABNORMAL HIGH (ref 0–149)
VLDL Cholesterol Cal: 28 mg/dL (ref 5–40)

## 2019-12-17 LAB — HEMOGLOBIN A1C
Est. average glucose Bld gHb Est-mCnc: 171 mg/dL
Hgb A1c MFr Bld: 7.6 % — ABNORMAL HIGH (ref 4.8–5.6)

## 2019-12-18 NOTE — Progress Notes (Signed)
This visit occurred during the SARS-CoV-2 public health emergency.  Safety protocols were in place, including screening questions prior to the visit, additional usage of staff PPE, and extensive cleaning of exam room while observing appropriate contact time as indicated for disinfecting solutions.  Subjective:     Patient ID: Jean [name] , female    DOB: Jul 25, 1941 , 79 y.o.   MRN: [id number]   Chief Complaint  Patient presents with  . Annual Exam  . Hypertension  . Diabetes    HPI  She is here today for a full physical exam. She is no longer followed by GYN. She has no specific concerns or complaints at this time.   Hypertension This is a chronic problem. The current episode started more than 1 year ago. The problem has been gradually improving since onset. The problem is controlled. Pertinent negatives include no blurred vision, chest pain, palpitations or shortness of breath. Risk factors for coronary artery disease include diabetes mellitus and dyslipidemia. Past treatments include ACE inhibitors. The current treatment provides moderate improvement. Hypertensive end-organ damage includes kidney disease.  Diabetes She presents for her follow-up diabetic visit. She has type 2 diabetes mellitus. Her disease course has been worsening. There are no hypoglycemic associated symptoms. Pertinent negatives for diabetes include no blurred vision and no chest pain. There are no hypoglycemic complications. Diabetic complications include nephropathy. Risk factors for coronary artery disease include diabetes mellitus, dyslipidemia, hypertension and post-menopausal. Current diabetic treatment includes oral agent (monotherapy). She is compliant with treatment most of the time. She is following a diabetic diet. She participates in exercise intermittently. Her home blood glucose trend is decreasing steadily.     Past Medical History:  Diagnosis Date  . Blood transfusion without reported diagnosis     No reaction   . Diabetes mellitus without complication ([place])   . Hypertension      Family History  Problem Relation Age of Onset  . Breast cancer Sister   . Breast cancer Sister   . Healthy Mother   . Healthy Father      Current Outpatient Medications:  .  amLODipine (NORVASC) 10 MG tablet, TAKE 1 TABLET BY MOUTH  DAILY, Disp: 90 tablet, Rfl: 3 .  aspirin EC 81 MG tablet, Take 81 mg by mouth daily., Disp: , Rfl:  .  benazepril (LOTENSIN) 20 MG tablet, TAKE 1 TABLET BY MOUTH  DAILY, Disp: 90 tablet, Rfl: 1 .  cholecalciferol (VITAMIN D) 1000 UNITS tablet, Take 1,000 Units by mouth daily., Disp: , Rfl:  .  cinacalcet (SENSIPAR) 30 MG tablet, Take 30 mg by mouth daily., Disp: , Rfl:  .  glucose blood test strip, Use as instructed to check blood sugars 2 times per day dx: e11.22, Disp: 300 each, Rfl: 2 .  rosuvastatin (CRESTOR) 20 MG tablet, TAKE 1 TABLET BY MOUTH  DAILY, Disp: 90 tablet, Rfl: 1 .  TRADJENTA 5 MG TABS tablet, TAKE ONE TABLET BY MOUTH ONCE DAILY, Disp: 90 tablet, Rfl: 5   Allergies  Allergen Reactions  . Penicillins Anaphylaxis     The patient states she uses post menopausal status for birth control. Last LMP was No LMP recorded. Patient has had a hysterectomy.. Negative for Dysmenorrhea  Negative for: breast discharge, breast lump(s), breast pain and breast self exam. Associated symptoms include abnormal vaginal bleeding. Pertinent negatives include abnormal bleeding (hematology), anxiety, decreased libido, depression, difficulty falling sleep, dyspareunia, history of infertility, nocturia, sexual dysfunction, sleep disturbances, urinary incontinence, urinary urgency, vaginal discharge and  vaginal itching. Diet regular.The patient states her exercise level is  intermittent.   . The patient's tobacco use is:  Social History   Tobacco Use  Smoking Status Never Smoker  Smokeless Tobacco Never Used  . She has been exposed to passive smoke. The patient's alcohol use is:   Social History   Substance and Sexual Activity  Alcohol Use No    Review of Systems  Constitutional: Negative.   HENT: Negative.   Eyes: Negative.  Negative for blurred vision.  Respiratory: Negative.  Negative for shortness of breath.   Cardiovascular: Negative.  Negative for chest pain and palpitations.  Endocrine: Negative.   Genitourinary: Negative.   Musculoskeletal: Negative.   Skin: Negative.   Allergic/Immunologic: Negative.   Neurological: Negative.   Hematological: Negative.   Psychiatric/Behavioral: Negative.      Today's Vitals   12/16/19 1054  BP: 138/70  Pulse: (!) 108  Temp: 98.2 F (36.8 C)  TempSrc: Oral  Weight: 157 lb 12.8 oz (71.6 kg)  Height: _0  (1.651 m)  PainSc: 0-No pain   Body mass index is 26.26 kg/m.   Objective:  Physical Exam Vitals and nursing note reviewed.  Constitutional:      Appearance: Normal appearance.  HENT:     Head: Normocephalic and atraumatic.     Right Ear: Tympanic membrane, ear canal and external ear normal.     Left Ear: Tympanic membrane, ear canal and external ear normal.     Nose:     Comments: Deferred, masked    Mouth/Throat:     Comments: Deferred, masked Eyes:     Extraocular Movements: Extraocular movements intact.     Conjunctiva/sclera: Conjunctivae normal.     Pupils: Pupils are equal, round, and reactive to light.  Cardiovascular:     Rate and Rhythm: Normal rate and regular rhythm.     Pulses:          Dorsalis pedis pulses are 1+ on the right side and 1+ on the left side.     Heart sounds: Normal heart sounds.  Pulmonary:     Effort: Pulmonary effort is normal.     Breath sounds: Normal breath sounds.  Chest:     Breasts: Tanner Score is 5.        Right: Normal.        Left: Normal.  Abdominal:     General: Abdomen is flat. Bowel sounds are normal.     Palpations: Abdomen is soft.  Genitourinary:    Comments: deferred Musculoskeletal:        General: Normal range of motion.      Cervical back: Normal range of motion and neck supple.  Feet:     Right foot:     Protective Sensation: 5 sites tested. 5 sites sensed.     Skin integrity: Callus and dry skin present.     Toenail Condition: Right toenails are normal.     Left foot:     Protective Sensation: 5 sites tested. 5 sites sensed.     Skin integrity: Callus and dry skin present.     Toenail Condition: Left toenails are normal.  Skin:    General: Skin is warm and dry.  Neurological:     General: No focal deficit present.     Mental Status: She is alert and oriented to person, place, and time.  Psychiatric:        Mood and Affect: Mood normal.        Behavior:  Behavior normal.         Assessment And Plan:     1. Routine general medical examination at health care facility  A full exam was performed.  Importance of monthly self breast exams was discussed with the patient. PATIENT IS ADVISED TO GET 30-45 MINUTES REGULAR EXERCISE NO LESS THAN FOUR TO FIVE DAYS PER WEEK - BOTH WEIGHTBEARING EXERCISES AND AEROBIC ARE RECOMMENDED.  SHE IS ADVISED TO FOLLOW A HEALTHY DIET WITH AT LEAST SIX FRUITS/VEGGIES PER DAY, DECREASE INTAKE OF RED MEAT, AND TO INCREASE FISH INTAKE TO TWO DAYS PER WEEK.  MEATS/FISH SHOULD NOT BE FRIED, BAKED OR BROILED IS PREFERABLE.  I SUGGEST WEARING SPF 50 SUNSCREEN ON EXPOSED PARTS AND ESPECIALLY WHEN IN THE DIRECT SUNLIGHT FOR AN EXTENDED PERIOD OF TIME.  PLEASE AVOID FAST FOOD RESTAURANTS AND INCREASE YOUR WATER INTAKE.   2. Hypertensive nephropathy  Chronic, fair control. She is advised that optimal BP is less than 130/80. She is encouraged to incorporate more exercise into her daily routine. ECG performed, NSR w/o acute changes. She is also encouraged to limit her salt intake.   - EKG 12-Lead  3. Type 2 diabetes mellitus with stage 2 chronic kidney disease, without long-term current use of insulin (HCC)  Chronic, she will continue with current meds. I will check urine microalbumin  today. Diabetic foot exam was performed. I DISCUSSED WITH THE PATIENT AT LENGTH REGARDING THE GOALS OF GLYCEMIC CONTROL AND POSSIBLE LONG-TERM COMPLICATIONS.  I  ALSO STRESSED THE IMPORTANCE OF COMPLIANCE WITH HOME GLUCOSE MONITORING, DIETARY RESTRICTIONS INCLUDING AVOIDANCE OF SUGARY DRINKS/PROCESSED FOODS,  ALONG WITH REGULAR EXERCISE.  I  ALSO STRESSED THE IMPORTANCE OF ANNUAL EYE EXAMS, SELF FOOT CARE AND COMPLIANCE WITH OFFICE VISITS.  - CMP14+EGFR - CBC - Lipid panel - Hemoglobin A1c   [name], MD    THE PATIENT IS ENCOURAGED TO PRACTICE SOCIAL DISTANCING DUE TO THE COVID-19 PANDEMIC.

## 2020-01-07 ENCOUNTER — Telehealth: Payer: Self-pay

## 2020-01-24 ENCOUNTER — Other Ambulatory Visit: Payer: Self-pay | Admitting: Internal Medicine

## 2020-01-24 DIAGNOSIS — E1122 Type 2 diabetes mellitus with diabetic chronic kidney disease: Secondary | ICD-10-CM

## 2020-01-24 DIAGNOSIS — N182 Chronic kidney disease, stage 2 (mild): Secondary | ICD-10-CM

## 2020-02-10 ENCOUNTER — Telehealth: Payer: Self-pay

## 2020-02-21 ENCOUNTER — Other Ambulatory Visit: Payer: Self-pay | Admitting: Internal Medicine

## 2020-03-23 DIAGNOSIS — N1832 Chronic kidney disease, stage 3b: Secondary | ICD-10-CM | POA: Diagnosis not present

## 2020-03-24 ENCOUNTER — Telehealth: Payer: Self-pay

## 2020-03-26 ENCOUNTER — Other Ambulatory Visit: Payer: Self-pay | Admitting: Internal Medicine

## 2020-03-28 DIAGNOSIS — D631 Anemia in chronic kidney disease: Secondary | ICD-10-CM | POA: Diagnosis not present

## 2020-03-28 DIAGNOSIS — I129 Hypertensive chronic kidney disease with stage 1 through stage 4 chronic kidney disease, or unspecified chronic kidney disease: Secondary | ICD-10-CM | POA: Diagnosis not present

## 2020-03-28 DIAGNOSIS — N182 Chronic kidney disease, stage 2 (mild): Secondary | ICD-10-CM | POA: Diagnosis not present

## 2020-03-28 DIAGNOSIS — E213 Hyperparathyroidism, unspecified: Secondary | ICD-10-CM | POA: Diagnosis not present

## 2020-04-19 ENCOUNTER — Ambulatory Visit (INDEPENDENT_AMBULATORY_CARE_PROVIDER_SITE_OTHER): Payer: Medicare Other | Admitting: Internal Medicine

## 2020-04-19 ENCOUNTER — Other Ambulatory Visit: Payer: Self-pay

## 2020-04-19 ENCOUNTER — Encounter: Payer: Self-pay | Admitting: Internal Medicine

## 2020-04-19 VITALS — BP 114/66 | HR 100 | Temp 98.3°F | Ht 65.0 in | Wt 153.4 lb

## 2020-04-19 DIAGNOSIS — H6123 Impacted cerumen, bilateral: Secondary | ICD-10-CM | POA: Diagnosis not present

## 2020-04-19 DIAGNOSIS — I129 Hypertensive chronic kidney disease with stage 1 through stage 4 chronic kidney disease, or unspecified chronic kidney disease: Secondary | ICD-10-CM

## 2020-04-19 DIAGNOSIS — E1122 Type 2 diabetes mellitus with diabetic chronic kidney disease: Secondary | ICD-10-CM

## 2020-04-19 DIAGNOSIS — Z23 Encounter for immunization: Secondary | ICD-10-CM

## 2020-04-19 DIAGNOSIS — N182 Chronic kidney disease, stage 2 (mild): Secondary | ICD-10-CM | POA: Diagnosis not present

## 2020-04-19 NOTE — Progress Notes (Signed)
I,Katawbba Wiggins,acting as a Neurosurgeon for Gwynneth Aliment, MD.,have documented all relevant documentation on the behalf of Gwynneth Aliment, MD,as directed by  Gwynneth Aliment, MD while in the presence of Gwynneth Aliment, MD.  This visit occurred during the SARS-CoV-2 public health emergency.  Safety protocols were in place, including screening questions prior to the visit, additional usage of staff PPE, and extensive cleaning of exam room while observing appropriate contact time as indicated for disinfecting solutions.  Subjective:     Patient ID: Jean [name] , female    DOB: 1941/04/13 , 79 y.o.   MRN: [id number]   Chief Complaint  Patient presents with  . Diabetes  . Ears flushed  . Hypertension    HPI  She is here today for a diabetes and blood pressure f/u.  The patient also request her ears to be flushed.  She reports always having a lot of wax build up.   Diabetes She presents for her follow-up diabetic visit. She has type 2 diabetes mellitus. Her disease course has been worsening. Hypoglycemia symptoms include headaches. Pertinent negatives for diabetes include no blurred vision. There are no hypoglycemic complications. Diabetic complications include nephropathy. Risk factors for coronary artery disease include diabetes mellitus, dyslipidemia, hypertension and post-menopausal. Current diabetic treatment includes oral agent (monotherapy). She is compliant with treatment most of the time. She is following a diabetic diet. She participates in exercise intermittently. Her home blood glucose trend is decreasing steadily. An ACE inhibitor/angiotensin II receptor blocker is being taken. Eye exam is not current.  Hypertension This is a chronic problem. The current episode started more than 1 year ago. The problem has been gradually improving since onset. The problem is controlled. Associated symptoms include headaches. Pertinent negatives include no blurred vision. Risk factors for coronary  artery disease include diabetes mellitus and dyslipidemia. Past treatments include ACE inhibitors. The current treatment provides moderate improvement. Hypertensive end-organ damage includes kidney disease.     Past Medical History:  Diagnosis Date  . Blood transfusion without reported diagnosis    No reaction   . Diabetes mellitus without complication (HCC)   . Hypertension      Family History  Problem Relation Age of Onset  . Breast cancer Sister   . Breast cancer Sister   . Healthy Mother   . Healthy Father      Current Outpatient Medications:  .  ACCU-CHEK AVIVA PLUS test strip, USE AS DIRECTED TO CHECK  BLOOD SUGAR TWICE DAILY, Disp: 200 strip, Rfl: 3 .  amLODipine (NORVASC) 10 MG tablet, TAKE 1 TABLET BY MOUTH  DAILY, Disp: 90 tablet, Rfl: 3 .  aspirin EC 81 MG tablet, Take 81 mg by mouth daily., Disp: , Rfl:  .  benazepril (LOTENSIN) 20 MG tablet, TAKE 1 TABLET BY MOUTH  DAILY, Disp: 90 tablet, Rfl: 3 .  cholecalciferol (VITAMIN D) 1000 UNITS tablet, Take 1,000 Units by mouth daily., Disp: , Rfl:  .  cinacalcet (SENSIPAR) 30 MG tablet, Take 30 mg by mouth daily., Disp: , Rfl:  .  rosuvastatin (CRESTOR) 20 MG tablet, TAKE 1 TABLET BY MOUTH  DAILY, Disp: 90 tablet, Rfl: 3 .  TRADJENTA 5 MG TABS tablet, TAKE ONE TABLET BY MOUTH ONCE DAILY, Disp: 90 tablet, Rfl: 5   Allergies  Allergen Reactions  . Penicillins Anaphylaxis     Review of Systems  Constitutional: Negative.   Eyes: Negative for blurred vision.  Respiratory: Negative.   Cardiovascular: Negative.   Gastrointestinal: Negative.  Neurological: Positive for headaches.  Psychiatric/Behavioral: Negative.   All other systems reviewed and are negative.    Today's Vitals   04/19/20 1114  BP: 114/66  Pulse: 100  Temp: 98.3 F (36.8 C)  TempSrc: Oral  Weight: 153 lb 6.4 oz (69.6 kg)  Height: 5\' 5"  (1.651 m)  PainSc: 1   PainLoc: Leg   Body mass index is 25.53 kg/m.  Wt Readings from Last 3 Encounters:   04/19/20 153 lb 6.4 oz (69.6 kg)  12/16/19 157 lb 12.8 oz (71.6 kg)  12/16/19 157 lb 12.8 oz (71.6 kg)   Objective:  Physical Exam Vitals and nursing note reviewed.  Constitutional:      Appearance: Normal appearance.  HENT:     Head: Normocephalic and atraumatic.     Right Ear: Ear canal and external ear normal. There is impacted cerumen.     Left Ear: Ear canal and external ear normal. There is impacted cerumen.     Nose:     Comments: Deferred, masked    Mouth/Throat:     Comments: Deferred, masked Cardiovascular:     Rate and Rhythm: Normal rate and regular rhythm.     Heart sounds: Normal heart sounds.  Pulmonary:     Effort: Pulmonary effort is normal.     Breath sounds: Normal breath sounds.  Skin:    General: Skin is warm.  Neurological:     General: No focal deficit present.     Mental Status: She is alert and oriented to person, place, and time.  Psychiatric:        Mood and Affect: Mood normal.        Behavior: Behavior normal.         Assessment And Plan:     1. Type 2 diabetes mellitus with stage 2 chronic kidney disease, without long-term current use of insulin (HCC) Comments: Chronic, I will check an a1c today. She was recently evaluated by Renal, I will check renal function at her next visit.  - Hemoglobin A1c  2. Hypertensive nephropathy Comments: Chronic, well controlled. She will continue with current meds. Encouraged to follow a low sodium diet.   3. Bilateral impacted cerumen  AFTER OBTAINING VERBAL CONSENT, BOTH EARS WERE FLUSHED BY IRRIGATION. SHE TOLERATED PROCEDURE WELL WITHOUT ANY COMPLICATIONS. NO TM ABNORMALITIES WERE NOTED. - Ear Lavage  4. Immunization due Comments: She was given high dose flu vaccine to update her immunization history.  - Flu Vaccine QUAD High Dose(Fluad)     Patient was given opportunity to ask questions. Patient verbalized understanding of the plan and was able to repeat key elements of the plan. All questions  were answered to their satisfaction.  12/18/19, MD   I, Gwynneth Aliment, MD, have reviewed all documentation for this visit. The documentation on 04/19/20 for the exam, diagnosis, procedures, and orders are all accurate and complete.  THE PATIENT IS ENCOURAGED TO PRACTICE SOCIAL DISTANCING DUE TO THE COVID-19 PANDEMIC.

## 2020-04-19 NOTE — Patient Instructions (Signed)
Diabetes Mellitus and Exercise Exercising regularly is important for your overall health, especially when you have diabetes (diabetes mellitus). Exercising is not only about losing weight. It has many other health benefits, such as increasing muscle strength and bone density and reducing body fat and stress. This leads to improved fitness, flexibility, and endurance, all of which result in better overall health. Exercise has additional benefits for people with diabetes, including:  Reducing appetite.  Helping to lower and control blood glucose.  Lowering blood pressure.  Helping to control amounts of fatty substances (lipids) in the blood, such as cholesterol and triglycerides.  Helping the body to respond better to insulin (improving insulin sensitivity).  Reducing how much insulin the body needs.  Decreasing the risk for heart disease by: ? Lowering cholesterol and triglyceride levels. ? Increasing the levels of good cholesterol. ? Lowering blood glucose levels. What is my activity plan? Your health care provider or certified diabetes educator can help you make a plan for the type and frequency of exercise (activity plan) that works for you. Make sure that you:  Do at least 150 minutes of moderate-intensity or vigorous-intensity exercise each week. This could be brisk walking, biking, or water aerobics. ? Do stretching and strength exercises, such as yoga or weightlifting, at least 2 times a week. ? Spread out your activity over at least 3 days of the week.  Get some form of physical activity every day. ? Do not go more than 2 days in a row without some kind of physical activity. ? Avoid being inactive for more than 30 minutes at a time. Take frequent breaks to walk or stretch.  Choose a type of exercise or activity that you enjoy, and set realistic goals.  Start slowly, and gradually increase the intensity of your exercise over time. What do I need to know about managing my  diabetes?   Check your blood glucose before and after exercising. ? If your blood glucose is 240 mg/dL (13.3 mmol/L) or higher before you exercise, check your urine for ketones. If you have ketones in your urine, do not exercise until your blood glucose returns to normal. ? If your blood glucose is 100 mg/dL (5.6 mmol/L) or lower, eat a snack containing 15-20 grams of carbohydrate. Check your blood glucose 15 minutes after the snack to make sure that your level is above 100 mg/dL (5.6 mmol/L) before you start your exercise.  Know the symptoms of low blood glucose (hypoglycemia) and how to treat it. Your risk for hypoglycemia increases during and after exercise. Common symptoms of hypoglycemia can include: ? Hunger. ? Anxiety. ? Sweating and feeling clammy. ? Confusion. ? Dizziness or feeling light-headed. ? Increased heart rate or palpitations. ? Blurry vision. ? Tingling or numbness around the mouth, lips, or tongue. ? Tremors or shakes. ? Irritability.  Keep a rapid-acting carbohydrate snack available before, during, and after exercise to help prevent or treat hypoglycemia.  Avoid injecting insulin into areas of the body that are going to be exercised. For example, avoid injecting insulin into: ? The arms, when playing tennis. ? The legs, when jogging.  Keep records of your exercise habits. Doing this can help you and your health care provider adjust your diabetes management plan as needed. Write down: ? Food that you eat before and after you exercise. ? Blood glucose levels before and after you exercise. ? The type and amount of exercise you have done. ? When your insulin is expected to peak, if you use   insulin. Avoid exercising at times when your insulin is peaking.  When you start a new exercise or activity, work with your health care provider to make sure the activity is safe for you, and to adjust your insulin, medicines, or food intake as needed.  Drink plenty of water while  you exercise to prevent dehydration or heat stroke. Drink enough fluid to keep your urine clear or pale yellow. Summary  Exercising regularly is important for your overall health, especially when you have diabetes (diabetes mellitus).  Exercising has many health benefits, such as increasing muscle strength and bone density and reducing body fat and stress.  Your health care provider or certified diabetes educator can help you make a plan for the type and frequency of exercise (activity plan) that works for you.  When you start a new exercise or activity, work with your health care provider to make sure the activity is safe for you, and to adjust your insulin, medicines, or food intake as needed. This information is not intended to replace advice given to you by your health care provider. Make sure you discuss any questions you have with your health care provider. Document Revised: 02/06/2017 Document Reviewed: 12/25/2015 Elsevier Patient Education  2020 [employer]  

## 2020-04-20 LAB — HEMOGLOBIN A1C
Est. average glucose Bld gHb Est-mCnc: 151 mg/dL
Hgb A1c MFr Bld: 6.9 % — ABNORMAL HIGH (ref 4.8–5.6)

## 2020-04-25 ENCOUNTER — Telehealth: Payer: Medicare Other

## 2020-04-25 ENCOUNTER — Other Ambulatory Visit: Payer: Self-pay

## 2020-04-25 ENCOUNTER — Ambulatory Visit: Payer: Self-pay

## 2020-04-25 DIAGNOSIS — E1122 Type 2 diabetes mellitus with diabetic chronic kidney disease: Secondary | ICD-10-CM

## 2020-04-25 DIAGNOSIS — N182 Chronic kidney disease, stage 2 (mild): Secondary | ICD-10-CM

## 2020-04-25 DIAGNOSIS — I129 Hypertensive chronic kidney disease with stage 1 through stage 4 chronic kidney disease, or unspecified chronic kidney disease: Secondary | ICD-10-CM

## 2020-04-26 NOTE — Patient Instructions (Signed)
Visit Information  Goals Addressed      Patient Stated   .  "I would like to learn more about Meal planning" (pt-stated)   On track     Current Barriers:  [name] Knowledge Deficits related to diabetes disease process and Meal Planning . Chronic Disease Management support and education needs related to DMII, Hypertensive Nephropathy  Nurse Case Manager Clinical Goal(s):  . 04/25/20 New Over the next 180 days, patient will work with CCM RNCM to address needs related to disease education and support for improved Self Health management of Diabetes   CCM RN CM Interventions:   04/25/20 call completed with patient  . Evaluation of current treatment plan related to diabetes and patient's adherence to plan as established by provider. . Provided education to patient re: target A1C is down to 6.9 from 7.6; Re-iterated target A1c; Re-educated on daily glycemic control with FBS 80-130, <180 after meals; Re-educated on dietary and exercise recommendations   . Positive reinforcement provided to patient making efforts to lower her A1c and improve her overall health  . Reviewed DM medication regimen with patient and assessed for medication adherence . Provided patient with printed  educational materials related to Diabetes management with Diabetes Care Schedule; Grocery Shopping with Diabetes; Preventing Complications from Diabetes . Advised patient, providing education and rationale, to check cbg 1-2 daily before meals  and record, calling RNCM and or PCP provider for findings outside established parameters . Discussed plans with patient for ongoing care management follow up and provided patient with direct contact information for care management team   Patient Self Care Activities:  . Self administers medications as prescribed . Attends all scheduled provider appointments . Calls pharmacy for medication refills . Performs ADL's independently . Performs IADL's independently . Calls provider office for new  concerns or questions  Please see past updates related to this goal by clicking on the "Past Updates" button in the selected goal        Patient verbalizes understanding of instructions provided today.   Telephone follow up appointment with care management team member scheduled for: 07/18/20  [name], RN, BSN, CCM Care Management Coordinator [facility]  Direct Phone: [phone]

## 2020-04-26 NOTE — Chronic Care Management (AMB) (Signed)
Chronic Care Management   Follow Up Note   04/25/2020 Name: Jean [name] MRN: [id number] DOB: 1941/07/22  Referred by: [name], MD Reason for referral : Chronic Care Management (FU RN CM Call )   Jean [name] is a 79 y.o. year old female who is a primary care patient of [name], MD. The CCM team was consulted for assistance with chronic disease management and care coordination needs.    Review of patient status, including review of consultants reports, relevant laboratory and other test results, and collaboration with appropriate care team members and the patient's provider was performed as part of comprehensive patient evaluation and provision of chronic care management services.    SDOH (Social Determinants of Health) assessments performed: Yes- no acute challenges identified at this time See Care Plan activities for detailed interventions related to SDOH)   Placed outbound CCM RN CM follow up call for a care plan update.     Outpatient Encounter Medications as of 04/25/2020  Medication Sig  . ACCU-CHEK AVIVA PLUS test strip USE AS DIRECTED TO CHECK  BLOOD SUGAR TWICE DAILY  . amLODipine (NORVASC) 10 MG tablet TAKE 1 TABLET BY MOUTH  DAILY  . aspirin EC 81 MG tablet Take 81 mg by mouth daily.  . benazepril (LOTENSIN) 20 MG tablet TAKE 1 TABLET BY MOUTH  DAILY  . cholecalciferol (VITAMIN D) 1000 UNITS tablet Take 1,000 Units by mouth daily.  . cinacalcet (SENSIPAR) 30 MG tablet Take 30 mg by mouth daily.  . rosuvastatin (CRESTOR) 20 MG tablet TAKE 1 TABLET BY MOUTH  DAILY  . TRADJENTA 5 MG TABS tablet TAKE ONE TABLET BY MOUTH ONCE DAILY   No facility-administered encounter medications on file as of 04/25/2020.     Objective:  Lab Results  Component Value Date   HGBA1C 6.9 (H) 04/19/2020   HGBA1C 7.6 (H) 12/16/2019   HGBA1C 6.9 (H) 07/15/2019   Lab Results  Component Value Date   MICROALBUR 10 12/16/2019   LDLCALC 87 12/16/2019   CREATININE 1.11 (H)  12/16/2019   BP Readings from Last 3 Encounters:  04/19/20 114/66  12/16/19 138/70  12/16/19 138/70    Goals Addressed      Patient Stated   .  "I would like to learn more about Meal planning" (pt-stated)   On track     Current Barriers:  [name] Knowledge Deficits related to diabetes disease process and Meal Planning . Chronic Disease Management support and education needs related to DMII, Hypertensive Nephropathy  Nurse Case Manager Clinical Goal(s):  . 04/25/20 New Over the next 180 days, patient will work with CCM RNCM to address needs related to disease education and support for improved Self Health management of Diabetes   CCM RN CM Interventions:   04/25/20 call completed with patient  . Evaluation of current treatment plan related to diabetes and patient's adherence to plan as established by provider. . Provided education to patient re: target A1C is down to 6.9 from 7.6; Re-iterated target A1c; Re-educated on daily glycemic control with FBS 80-130, <180 after meals; Re-educated on dietary and exercise recommendations   . Positive reinforcement provided to patient making efforts to lower her A1c and improve her overall health  . Reviewed DM medication regimen with patient and assessed for medication adherence . Provided patient with printed  educational materials related to Diabetes management with Diabetes Care Schedule; Grocery Shopping with Diabetes; Preventing Complications from Diabetes . Advised patient, providing education and rationale, to check cbg  1-2 daily before meals  and record, calling RNCM and or PCP provider for findings outside established parameters . Discussed plans with patient for ongoing care management follow up and provided patient with direct contact information for care management team   Patient Self Care Activities:  . Self administers medications as prescribed . Attends all scheduled provider appointments . Calls pharmacy for medication  refills . Performs ADL's independently . Performs IADL's independently . Calls provider office for new concerns or questions  Please see past updates related to this goal by clicking on the "Past Updates" button in the selected goal        Plan:   Telephone follow up appointment with care management team member scheduled for: 07/18/20  [name], RN, BSN, CCM Care Management Coordinator [facility]  Direct Phone: [phone]

## 2020-07-18 ENCOUNTER — Telehealth: Payer: Medicare Other

## 2020-08-23 ENCOUNTER — Encounter: Payer: Self-pay | Admitting: Internal Medicine

## 2020-08-23 ENCOUNTER — Ambulatory Visit (INDEPENDENT_AMBULATORY_CARE_PROVIDER_SITE_OTHER): Payer: Medicare Other | Admitting: Internal Medicine

## 2020-08-23 ENCOUNTER — Other Ambulatory Visit: Payer: Self-pay

## 2020-08-23 VITALS — BP 126/84 | HR 94 | Temp 98.3°F | Ht 65.0 in | Wt 152.8 lb

## 2020-08-23 DIAGNOSIS — N182 Chronic kidney disease, stage 2 (mild): Secondary | ICD-10-CM

## 2020-08-23 DIAGNOSIS — E1122 Type 2 diabetes mellitus with diabetic chronic kidney disease: Secondary | ICD-10-CM | POA: Diagnosis not present

## 2020-08-23 DIAGNOSIS — M1712 Unilateral primary osteoarthritis, left knee: Secondary | ICD-10-CM

## 2020-08-23 DIAGNOSIS — I129 Hypertensive chronic kidney disease with stage 1 through stage 4 chronic kidney disease, or unspecified chronic kidney disease: Secondary | ICD-10-CM | POA: Diagnosis not present

## 2020-08-23 DIAGNOSIS — Z6825 Body mass index (BMI) 25.0-25.9, adult: Secondary | ICD-10-CM

## 2020-08-23 DIAGNOSIS — R000 Tachycardia, unspecified: Secondary | ICD-10-CM | POA: Diagnosis not present

## 2020-08-23 NOTE — Patient Instructions (Signed)
Diabetes Mellitus and Foot Care Foot care is an important part of your health, especially when you have diabetes. Diabetes may cause you to have problems because of poor blood flow (circulation) to your feet and legs, which can cause your skin to:  Become thinner and drier.  Break more easily.  Heal more slowly.  Peel and crack. You may also have nerve damage (neuropathy) in your legs and feet, causing decreased feeling in them. This means that you may not notice minor injuries to your feet that could lead to more serious problems. Noticing and addressing any potential problems early is the best way to prevent future foot problems. How to care for your feet Foot hygiene  Wash your feet daily with warm water and mild soap. Do not use hot water. Then, pat your feet and the areas between your toes until they are completely dry. Do not soak your feet as this can dry your skin.  Trim your toenails straight across. Do not dig under them or around the cuticle. File the edges of your nails with an emery board or nail file.  Apply a moisturizing lotion or petroleum jelly to the skin on your feet and to dry, brittle toenails. Use lotion that does not contain alcohol and is unscented. Do not apply lotion between your toes.   Shoes and socks  Wear clean socks or stockings every day. Make sure they are not too tight. Do not wear knee-high stockings since they may decrease blood flow to your legs.  Wear shoes that fit properly and have enough cushioning. Always look in your shoes before you put them on to be sure there are no objects inside.  To break in new shoes, wear them for just a few hours a day. This prevents injuries on your feet. Wounds, scrapes, corns, and calluses  Check your feet daily for blisters, cuts, bruises, sores, and redness. If you cannot see the bottom of your feet, use a mirror or ask someone for help.  Do not cut corns or calluses or try to remove them with medicine.  If you  find a minor scrape, cut, or break in the skin on your feet, keep it and the skin around it clean and dry. You may clean these areas with mild soap and water. Do not clean the area with peroxide, alcohol, or iodine.  If you have a wound, scrape, corn, or callus on your foot, look at it several times a day to make sure it is healing and not infected. Check for: ? Redness, swelling, or pain. ? Fluid or blood. ? Warmth. ? Pus or a bad smell.   General tips  Do not cross your legs. This may decrease blood flow to your feet.  Do not use heating pads or hot water bottles on your feet. They may burn your skin. If you have lost feeling in your feet or legs, you may not know this is happening until it is too late.  Protect your feet from hot and cold by wearing shoes, such as at the beach or on hot pavement.  Schedule a complete foot exam at least once a year (annually) or more often if you have foot problems. Report any cuts, sores, or bruises to your health care provider immediately. Where to find more information  American Diabetes Association: www.diabetes.org  Association of Diabetes Care & Education Specialists: www.diabeteseducator.org Contact a health care provider if:  You have a medical condition that increases your risk of infection and   you have any cuts, sores, or bruises on your feet.  You have an injury that is not healing.  You have redness on your legs or feet.  You feel burning or tingling in your legs or feet.  You have pain or cramps in your legs and feet.  Your legs or feet are numb.  Your feet always feel cold.  You have pain around any toenails. Get help right away if:  You have a wound, scrape, corn, or callus on your foot and: ? You have pain, swelling, or redness that gets worse. ? You have fluid or blood coming from the wound, scrape, corn, or callus. ? Your wound, scrape, corn, or callus feels warm to the touch. ? You have pus or a bad smell coming from  the wound, scrape, corn, or callus. ? You have a fever. ? You have a red line going up your leg. Summary  Check your feet every day for blisters, cuts, bruises, sores, and redness.  Apply a moisturizing lotion or petroleum jelly to the skin on your feet and to dry, brittle toenails.  Wear shoes that fit properly and have enough cushioning.  If you have foot problems, report any cuts, sores, or bruises to your health care provider immediately.  Schedule a complete foot exam at least once a year (annually) or more often if you have foot problems. This information is not intended to replace advice given to you by your health care provider. Make sure you discuss any questions you have with your health care provider. Document Revised: 02/03/2020 Document Reviewed: 02/03/2020 Elsevier Patient Education  2021 Elsevier Inc.  

## 2020-08-23 NOTE — Progress Notes (Signed)
I,Katawbba Wiggins,acting as a Education administrator for [name], MD.,have documented all relevant documentation on the behalf of [name], MD,as directed by  [name], MD while in the presence of [name], MD.  This visit occurred during the SARS-CoV-2 public health emergency.  Safety protocols were in place, including screening questions prior to the visit, additional usage of staff PPE, and extensive cleaning of exam room while observing appropriate contact time as indicated for disinfecting solutions.  Subjective:     Patient ID: Jean [name] , female    DOB: 01-24-1941 , 80 y.o.   MRN: [id number]   Chief Complaint  Patient presents with  . Diabetes  . Hypertension    HPI  She is here today for a diabetes and blood pressure f/u.  She reports compliance with meds. She denies headaches, chest pain and shortness of breath.   Diabetes She presents for her follow-up diabetic visit. She has type 2 diabetes mellitus. Her disease course has been worsening. Pertinent negatives for diabetes include no blurred vision. There are no hypoglycemic complications. Diabetic complications include nephropathy. Risk factors for coronary artery disease include diabetes mellitus, dyslipidemia, hypertension and post-menopausal. Current diabetic treatment includes oral agent (monotherapy). She is compliant with treatment most of the time. She is following a diabetic diet. She participates in exercise intermittently. Her home blood glucose trend is decreasing steadily. An ACE inhibitor/angiotensin II receptor blocker is being taken. Eye exam is not current.  Hypertension This is a chronic problem. The current episode started more than 1 year ago. The problem has been gradually improving since onset. The problem is controlled. Pertinent negatives include no blurred vision. Risk factors for coronary artery disease include diabetes mellitus and dyslipidemia. Past treatments include ACE inhibitors. The  current treatment provides moderate improvement. Hypertensive end-organ damage includes kidney disease.     Past Medical History:  Diagnosis Date  . Blood transfusion without reported diagnosis    No reaction   . Diabetes mellitus without complication ([place])   . Hypertension      Family History  Problem Relation Age of Onset  . Breast cancer Sister   . Breast cancer Sister   . Healthy Mother   . Healthy Father      Current Outpatient Medications:  .  ACCU-CHEK AVIVA PLUS test strip, USE AS DIRECTED TO CHECK  BLOOD SUGAR TWICE DAILY, Disp: 200 strip, Rfl: 3 .  amLODipine (NORVASC) 10 MG tablet, TAKE 1 TABLET BY MOUTH  DAILY, Disp: 90 tablet, Rfl: 3 .  aspirin EC 81 MG tablet, Take 81 mg by mouth daily., Disp: , Rfl:  .  benazepril (LOTENSIN) 20 MG tablet, TAKE 1 TABLET BY MOUTH  DAILY, Disp: 90 tablet, Rfl: 3 .  cholecalciferol (VITAMIN D) 1000 UNITS tablet, Take 1,000 Units by mouth daily., Disp: , Rfl:  .  cinacalcet (SENSIPAR) 30 MG tablet, Take 30 mg by mouth daily., Disp: , Rfl:  .  rosuvastatin (CRESTOR) 20 MG tablet, TAKE 1 TABLET BY MOUTH  DAILY, Disp: 90 tablet, Rfl: 3 .  TRADJENTA 5 MG TABS tablet, TAKE ONE TABLET BY MOUTH ONCE DAILY, Disp: 90 tablet, Rfl: 5   Allergies  Allergen Reactions  . Penicillins Anaphylaxis     Review of Systems  Constitutional: Negative.   Eyes: Negative for blurred vision.  Respiratory: Negative.   Cardiovascular: Negative.   Gastrointestinal: Negative.   Psychiatric/Behavioral: Negative.   All other systems reviewed and are negative.    Today's Vitals  08/23/20 1112  BP: 126/84  Pulse: 94  Temp: 98.3 F (36.8 C)  TempSrc: Oral  Weight: 152 lb 12.8 oz (69.3 kg)  Height: 5' 5"  (1.651 m)  PainSc: 7   PainLoc: Knee   Body mass index is 25.43 kg/m.  Wt Readings from Last 3 Encounters:  08/23/20 152 lb 12.8 oz (69.3 kg)  04/19/20 153 lb 6.4 oz (69.6 kg)  12/16/19 157 lb 12.8 oz (71.6 kg)   Objective:  Physical  Exam Vitals and nursing note reviewed.  Constitutional:      Appearance: Normal appearance. She is obese.  HENT:     Head: Normocephalic and atraumatic.  Cardiovascular:     Rate and Rhythm: Normal rate and regular rhythm.     Heart sounds: Normal heart sounds.  Pulmonary:     Breath sounds: Normal breath sounds.  Skin:    General: Skin is warm.  Neurological:     General: No focal deficit present.     Mental Status: She is alert and oriented to person, place, and time.         Assessment And Plan:     1. Type 2 diabetes mellitus with stage 2 chronic kidney disease, without long-term current use of insulin (HCC) Comments: Chronic. I will check labs as listed below. She is encouraged to follow dietary recommendations.  - Hemoglobin A1c - CMP14+EGFR - Protein electrophoresis, serum - Parathyroid Hormone, Intact w/Ca - Phosphorus  2. Hypertensive nephropathy Comments: Chronic, well controlled. She will continue with current meds. She is encouraged to follow low sodium diet.   3. Primary osteoarthritis of left knee Comments: Chronic. She reports steroid injection has helped her in the past. However, she did have glucose readings 300-400s after past injection. Pt advised that we can use sliding scale insulin prn to allow better control of her blood sugars. She appears to be apprehensive about moving forward with this plan. She states she felt "good" for 13 months after her last steroid injection. Pt will think it over and f/u with Ortho.   4. Tachycardia Comments: Admits to drinking coffee, states she has switched from [facility] to [place]. Advised to go back to [facility]. If persistent, switch to 1/2 caf.   5. Body mass index (BMI) 25.0-25.9, adult Comments: Her BMI is acceptable for her demographic. She is encouraged to aim for at least 150 minutes of exercise per week.    Patient was given opportunity to ask questions. Patient verbalized understanding of the plan and was able to  repeat key elements of the plan. All questions were answered to their satisfaction.  [name], MD   I, [name], MD, have reviewed all documentation for this visit. The documentation on 08/23/20 for the exam, diagnosis, procedures, and orders are all accurate and complete.  THE PATIENT IS ENCOURAGED TO PRACTICE SOCIAL DISTANCING DUE TO THE COVID-19 PANDEMIC.

## 2020-08-25 ENCOUNTER — Telehealth: Payer: Medicare Other

## 2020-08-25 LAB — PROTEIN ELECTROPHORESIS, SERUM
A/G Ratio: 1.3 (ref 0.7–1.7)
Albumin ELP: 4.6 g/dL — ABNORMAL HIGH (ref 2.9–4.4)
Alpha 1: 0.2 g/dL (ref 0.0–0.4)
Alpha 2: 0.9 g/dL (ref 0.4–1.0)
Beta: 1.3 g/dL (ref 0.7–1.3)
Gamma Globulin: 1.2 g/dL (ref 0.4–1.8)
Globulin, Total: 3.6 g/dL (ref 2.2–3.9)

## 2020-08-25 LAB — CMP14+EGFR
ALT: 16 IU/L (ref 0–32)
AST: 16 IU/L (ref 0–40)
Albumin/Globulin Ratio: 1.4 (ref 1.2–2.2)
Albumin: 4.8 g/dL — ABNORMAL HIGH (ref 3.7–4.7)
Alkaline Phosphatase: 147 IU/L — ABNORMAL HIGH (ref 44–121)
BUN/Creatinine Ratio: 11 — ABNORMAL LOW (ref 12–28)
BUN: 11 mg/dL (ref 8–27)
Bilirubin Total: 0.4 mg/dL (ref 0.0–1.2)
CO2: 23 mmol/L (ref 20–29)
Calcium: 10.4 mg/dL — ABNORMAL HIGH (ref 8.7–10.3)
Chloride: 102 mmol/L (ref 96–106)
Creatinine, Ser: 0.99 mg/dL (ref 0.57–1.00)
GFR calc Af Amer: 62 mL/min/{1.73_m2} (ref >59)
GFR calc non Af Amer: 54 mL/min/{1.73_m2} — ABNORMAL LOW (ref >59)
Globulin, Total: 3.4 g/dL (ref 1.5–4.5)
Glucose: 64 mg/dL — ABNORMAL LOW (ref 65–99)
Potassium: 4.3 mmol/L (ref 3.5–5.2)
Sodium: 142 mmol/L (ref 134–144)
Total Protein: 8.2 g/dL (ref 6.0–8.5)

## 2020-08-25 LAB — HEMOGLOBIN A1C
Est. average glucose Bld gHb Est-mCnc: 151 mg/dL
Hgb A1c MFr Bld: 6.9 % — ABNORMAL HIGH (ref 4.8–5.6)

## 2020-08-25 LAB — PHOSPHORUS: Phosphorus: 3.4 mg/dL (ref 3.0–4.3)

## 2020-08-25 LAB — PTH, INTACT AND CALCIUM: PTH: 47 pg/mL (ref 15–65)

## 2020-09-29 ENCOUNTER — Telehealth: Payer: Medicare Other

## 2020-09-29 ENCOUNTER — Ambulatory Visit (INDEPENDENT_AMBULATORY_CARE_PROVIDER_SITE_OTHER): Payer: Medicare Other

## 2020-09-29 DIAGNOSIS — I129 Hypertensive chronic kidney disease with stage 1 through stage 4 chronic kidney disease, or unspecified chronic kidney disease: Secondary | ICD-10-CM | POA: Diagnosis not present

## 2020-09-29 DIAGNOSIS — E1122 Type 2 diabetes mellitus with diabetic chronic kidney disease: Secondary | ICD-10-CM

## 2020-09-29 DIAGNOSIS — N182 Chronic kidney disease, stage 2 (mild): Secondary | ICD-10-CM

## 2020-10-05 DIAGNOSIS — E119 Type 2 diabetes mellitus without complications: Secondary | ICD-10-CM | POA: Diagnosis not present

## 2020-10-05 LAB — HM DIABETES EYE EXAM

## 2020-10-05 NOTE — Chronic Care Management (AMB) (Signed)
Chronic Care Management   CCM RN Visit Note  09/29/2020 Name: Jean [name] MRN: [id number] DOB: 1940/12/02  Subjective: Jean [name] is a 80 y.o. year old female who is a primary care patient of Glendale Chard, MD. The care management team was consulted for assistance with disease management and care coordination needs.    Engaged with patient by telephone for follow up visit in response to provider referral for case management and/or care coordination services.   Consent to Services:  The patient was given information about Chronic Care Management services, agreed to services, and gave verbal consent prior to initiation of services.  Please see initial visit note for detailed documentation.   Patient agreed to services and verbal consent obtained.   Assessment: Review of patient past medical history, allergies, medications, health status, including review of consultants reports, laboratory and other test data, was performed as part of comprehensive evaluation and provision of chronic care management services.   SDOH (Social Determinants of Health) assessments and interventions performed:  Yes, no acute challenges identified  CCM Care Plan  Allergies  Allergen Reactions  . Penicillins Anaphylaxis    Outpatient Encounter Medications as of 09/29/2020  Medication Sig  . ACCU-CHEK AVIVA PLUS test strip USE AS DIRECTED TO CHECK  BLOOD SUGAR TWICE DAILY  . amLODipine (NORVASC) 10 MG tablet TAKE 1 TABLET BY MOUTH  DAILY  . aspirin EC 81 MG tablet Take 81 mg by mouth daily.  . benazepril (LOTENSIN) 20 MG tablet TAKE 1 TABLET BY MOUTH  DAILY  . cholecalciferol (VITAMIN D) 1000 UNITS tablet Take 1,000 Units by mouth daily.  . cinacalcet (SENSIPAR) 30 MG tablet Take 30 mg by mouth daily.  . rosuvastatin (CRESTOR) 20 MG tablet TAKE 1 TABLET BY MOUTH  DAILY  . TRADJENTA 5 MG TABS tablet TAKE ONE TABLET BY MOUTH ONCE DAILY   No facility-administered encounter medications on file as of  09/29/2020.    Patient Active Problem List   Diagnosis Date Noted  . Type 2 diabetes mellitus with stage 2 chronic kidney disease, without long-term current use of insulin ([place]) 08/24/2018  . Hypertensive nephropathy 08/24/2018  . Gastroesophageal reflux disease without esophagitis 08/24/2018  . Pure hypercholesterolemia 08/24/2018    Conditions to be addressed/monitored:DM II, Hypertensive Nephropathy  Care Plan : Diabetes Type 2 (Adult)  Updates made by Lynne Logan, RN since 10/05/2020 12:00 AM    Problem: Glycemic Management (Diabetes, Type 2)   Priority: High    Long-Range Goal: Glycemic Management Optimized   Start Date: 09/29/2020  Expected End Date: 04/02/2021  This Visit's Progress: On track  Priority: High  Note:   Objective:  Lab Results  Component Value Date   HGBA1C 6.9 (H) 08/23/2020 .   Lab Results  Component Value Date   CREATININE 0.99 08/23/2020   CREATININE 1.11 (H) 12/16/2019   CREATININE 0.95 07/15/2019 .   [name] No results found for: EGFR Current Barriers:  [name] Knowledge Deficits related to basic Diabetes pathophysiology and self care/management . Knowledge Deficits related to medications used for management of diabetes Case Manager Clinical Goal(s):  . patient will demonstrate improved adherence to prescribed treatment plan for diabetes self care/management as evidenced by: daily monitoring and recording of CBG  adherence to ADA/ carb modified diet exercise 5 days/week adherence to prescribed medication regimen contacting provider for new or worsened symptoms or questions Interventions:  09/29/20 call completed with patient  . Collaboration with Glendale Chard, MD regarding development and update of comprehensive  plan of care as evidenced by provider attestation and co-signature . Inter-disciplinary care team collaboration (see longitudinal plan of care) . Provided education to patient about basic DM disease process . Review of patient status, including  review of consultants reports, relevant laboratory and other test results, and medications completed. . Reviewed medications with patient and discussed importance of medication adherence . Provided patient with written educational materials related to hypo and hyperglycemia and importance of correct treatment . Reviewed scheduled/upcoming provider appointments including: PCP follow up visit scheduled for 12/21/20 _0 :30 AM  . Advised patient, providing education and rationale, to check cbg before meals and at bedtime and record, calling the CCM team and PCP for findings outside established parameters.   . Discussed plans with patient for ongoing care management follow up and provided patient with direct contact information for care management team Self-Care Activities Self administers oral medications as prescribed Attends all scheduled provider appointments Checks blood sugars as prescribed and utilize hyper and hypoglycemia protocol as needed Adheres to prescribed ADA/carb modified Patient Goals: - to get my A1C below 6.0 Follow Up Plan: Telephone follow up appointment with care management team member scheduled for: 01/22/21    Plan:Telephone follow up appointment with care management team member scheduled for:  01/22/21  Barb Merino, RN, BSN, CCM Care Management Coordinator [place] [facility]  Direct Phone: [phone]

## 2020-10-05 NOTE — Patient Instructions (Signed)
Goals Addressed    Other   .  Glycemic Management Optimized   On track     Timeframe:  Long-Range Goal Priority:  High Start Date:  09/29/20                           Expected End Date:  04/02/21  Follow Up Date: 01/22/21  Self administers oral medications as prescribed Attends all scheduled provider appointments Checks blood sugars as prescribed and utilize hyper and hypoglycemia protocol as needed Adheres to prescribed ADA/carb modified

## 2020-10-09 DIAGNOSIS — E213 Hyperparathyroidism, unspecified: Secondary | ICD-10-CM | POA: Diagnosis not present

## 2020-10-09 DIAGNOSIS — N182 Chronic kidney disease, stage 2 (mild): Secondary | ICD-10-CM | POA: Diagnosis not present

## 2020-10-11 ENCOUNTER — Encounter: Payer: Self-pay | Admitting: Internal Medicine

## 2020-10-18 DIAGNOSIS — E213 Hyperparathyroidism, unspecified: Secondary | ICD-10-CM | POA: Diagnosis not present

## 2020-10-18 DIAGNOSIS — N182 Chronic kidney disease, stage 2 (mild): Secondary | ICD-10-CM | POA: Diagnosis not present

## 2020-10-18 DIAGNOSIS — D631 Anemia in chronic kidney disease: Secondary | ICD-10-CM | POA: Diagnosis not present

## 2020-10-18 DIAGNOSIS — I129 Hypertensive chronic kidney disease with stage 1 through stage 4 chronic kidney disease, or unspecified chronic kidney disease: Secondary | ICD-10-CM | POA: Diagnosis not present

## 2020-12-12 ENCOUNTER — Other Ambulatory Visit: Payer: Self-pay | Admitting: Internal Medicine

## 2020-12-12 DIAGNOSIS — E1122 Type 2 diabetes mellitus with diabetic chronic kidney disease: Secondary | ICD-10-CM

## 2020-12-12 DIAGNOSIS — N182 Chronic kidney disease, stage 2 (mild): Secondary | ICD-10-CM

## 2020-12-14 ENCOUNTER — Other Ambulatory Visit: Payer: Self-pay

## 2020-12-14 ENCOUNTER — Ambulatory Visit (INDEPENDENT_AMBULATORY_CARE_PROVIDER_SITE_OTHER): Payer: Medicare Other

## 2020-12-14 ENCOUNTER — Encounter: Payer: Medicare Other | Admitting: Internal Medicine

## 2020-12-14 VITALS — BP 128/68 | HR 96 | Temp 98.5°F | Ht 65.2 in | Wt 150.4 lb

## 2020-12-14 DIAGNOSIS — E1122 Type 2 diabetes mellitus with diabetic chronic kidney disease: Secondary | ICD-10-CM | POA: Diagnosis not present

## 2020-12-14 DIAGNOSIS — I129 Hypertensive chronic kidney disease with stage 1 through stage 4 chronic kidney disease, or unspecified chronic kidney disease: Secondary | ICD-10-CM

## 2020-12-14 DIAGNOSIS — Z0000 Encounter for general adult medical examination without abnormal findings: Secondary | ICD-10-CM | POA: Diagnosis not present

## 2020-12-14 DIAGNOSIS — N182 Chronic kidney disease, stage 2 (mild): Secondary | ICD-10-CM | POA: Diagnosis not present

## 2020-12-14 LAB — CBC WITH DIFFERENTIAL/PLATELET
Basophils Absolute: 0.1 10*3/uL (ref 0.0–0.2)
Basos: 2 %
EOS (ABSOLUTE): 0.1 10*3/uL (ref 0.0–0.4)
Eos: 2 %
Hematocrit: 38.4 % (ref 34.0–46.6)
Hemoglobin: 12.8 g/dL (ref 11.1–15.9)
Immature Grans (Abs): 0 10*3/uL (ref 0.0–0.1)
Immature Granulocytes: 0 %
Lymphocytes Absolute: 2.2 10*3/uL (ref 0.7–3.1)
Lymphs: 35 %
MCH: 28 pg (ref 26.6–33.0)
MCHC: 33.3 g/dL (ref 31.5–35.7)
MCV: 84 fL (ref 79–97)
Monocytes Absolute: 0.7 10*3/uL (ref 0.1–0.9)
Monocytes: 11 %
Neutrophils Absolute: 3.1 10*3/uL (ref 1.4–7.0)
Neutrophils: 50 %
Platelets: 261 10*3/uL (ref 150–450)
RBC: 4.57 x10E6/uL (ref 3.77–5.28)
RDW: 14.6 % (ref 11.7–15.4)
WBC: 6.2 10*3/uL (ref 3.4–10.8)

## 2020-12-14 LAB — POCT URINALYSIS DIPSTICK
Bilirubin, UA: NEGATIVE
Blood, UA: NEGATIVE
Glucose, UA: NEGATIVE
Ketones, UA: NEGATIVE
Leukocytes, UA: NEGATIVE
Nitrite, UA: NEGATIVE
Protein, UA: NEGATIVE
Spec Grav, UA: 1.015 (ref 1.010–1.025)
Urobilinogen, UA: 0.2 E.U./dL
pH, UA: 6 (ref 5.0–8.0)

## 2020-12-14 LAB — POCT UA - MICROALBUMIN
Albumin/Creatinine Ratio, Urine, POC: <30
Creatinine, POC: 100 mg/dL
Microalbumin Ur, POC: 10 mg/L

## 2020-12-14 NOTE — Progress Notes (Signed)
This visit occurred during the SARS-CoV-2 public health emergency.  Safety protocols were in place, including screening questions prior to the visit, additional usage of staff PPE, and extensive cleaning of exam room while observing appropriate contact time as indicated for disinfecting solutions.  Subjective:   Jean [name] is a 80 y.o. female who presents for Medicare Annual (Subsequent) preventive examination.  Review of Systems     Cardiac Risk Factors include: advanced age (>[id number], 70>65 women);diabetes mellitus;hypertension     Objective:    Today's Vitals   12/14/20 1132 12/14/20 1240  BP: 132/70 128/68  Pulse: 96   Temp: 98.5 F (36.9 C)   TempSrc: Oral   SpO2: 97%   Weight: 150 lb 6.4 oz (68.2 kg)   Height: 5' 5.2" (1.656 m)    Body mass index is 24.87 kg/m.  Advanced Directives 12/14/2020 12/16/2019 12/16/2018 12/01/2018  Does Patient Have a Medical Advance Directive? No No No No  Would patient like information on creating a medical advance directive? - No - Patient declined Yes (MAU/Ambulatory/Procedural Areas - Information given) -    Current Medications (verified) Outpatient Encounter Medications as of 12/14/2020  Medication Sig  . ACCU-CHEK AVIVA PLUS test strip CHECK BLOOD SUGAR TWICE  DAILY AS DIRECTED  . amLODipine (NORVASC) 10 MG tablet TAKE 1 TABLET BY MOUTH  DAILY  . aspirin EC 81 MG tablet Take 81 mg by mouth daily.  . benazepril (LOTENSIN) 20 MG tablet TAKE 1 TABLET BY MOUTH  DAILY  . cholecalciferol (VITAMIN D) 1000 UNITS tablet Take 1,000 Units by mouth daily.  . cinacalcet (SENSIPAR) 30 MG tablet Take 30 mg by mouth daily.  . rosuvastatin (CRESTOR) 20 MG tablet TAKE 1 TABLET BY MOUTH  DAILY  . TRADJENTA 5 MG TABS tablet TAKE ONE TABLET BY MOUTH ONCE DAILY   No facility-administered encounter medications on file as of 12/14/2020.    Allergies (verified) Penicillins   History: Past Medical History:  Diagnosis Date  . Blood transfusion without  reported diagnosis    No reaction   . Diabetes mellitus without complication (HCC)   . Hypertension    Past Surgical History:  Procedure Laterality Date  . ABDOMINAL HYSTERECTOMY    . BACK SURGERY    . BREAST EXCISIONAL BIOPSY    . BREAST SURGERY Bilateral    lumpectomy  . NEPHRECTOMY Right   . WRIST SURGERY     rt   Family History  Problem Relation Age of Onset  . Breast cancer Sister   . Breast cancer Sister   . Healthy Mother   . Healthy Father    Social History   Socioeconomic History  . Marital status: Married    Spouse name: Not on file  . Number of children: Not on file  . Years of education: Not on file  . Highest education level: Not on file  Occupational History  . Occupation: retired  Tobacco Use  . Smoking status: Never Smoker  . Smokeless tobacco: Never Used  Vaping Use  . Vaping Use: Never used  Substance and Sexual Activity  . Alcohol use: No  . Drug use: No  . Sexual activity: Not Currently  Other Topics Concern  . Not on file  Social History Narrative  . Not on file   Social Determinants of Health   Financial Resource Strain: Low Risk   . Difficulty of Paying Living Expenses: Not hard at all  Food Insecurity: No Food Insecurity  . Worried About [employer] Out of  Food in the Last Year: Never true  . Ran Out of Food in the Last Year: Never true  Transportation Needs: No Transportation Needs  . Lack of Transportation (Medical): No  . Lack of Transportation (Non-Medical): No  Physical Activity: Insufficiently Active  . Days of Exercise per Week: 6 days  . Minutes of Exercise per Session: 20 min  Stress: No Stress Concern Present  . Feeling of Stress : Not at all  Social Connections: Not on file    Tobacco Counseling Counseling given: Not Answered   Clinical Intake:  Pre-visit preparation completed: Yes  Pain : No/denies pain     Nutritional Status: BMI of 19-24  Normal Nutritional Risks: None Diabetes: Yes  How often do you  need to have someone help you when you read instructions, pamphlets, or other written materials from your doctor or pharmacy?: 1 - Never What is the last grade level you completed in school?: 12th grade  Diabetic? Yes Nutrition Risk Assessment:  Has the patient had any N/V/D within the last 2 months?  No  Does the patient have any non-healing wounds?  No  Has the patient had any unintentional weight loss or weight gain?  No   Diabetes:  Is the patient diabetic?  Yes  If diabetic, was a CBG obtained today?  No  Did the patient bring in their glucometer from home?  No  How often do you monitor your CBG's? Twice daily.   Financial Strains and Diabetes Management:  Are you having any financial strains with the device, your supplies or your medication? No .  Does the patient want to be seen by Chronic Care Management for management of their diabetes?  No  Would the patient like to be referred to a Nutritionist or for Diabetic Management?  No   Diabetic Exams:  Diabetic Eye Exam: Completed 10/05/2020 Diabetic Foot Exam: Completed 12/16/2019   Interpreter Needed?: No  Information entered by :: NAllen LPN   Activities of Daily Living In your present state of health, do you have any difficulty performing the following activities: 12/14/2020 12/16/2019  Hearing? N N  Vision? N N  Difficulty concentrating or making decisions? N N  Walking or climbing stairs? N N  Dressing or bathing? N N  Doing errands, shopping? N N  Preparing Food and eating ? N N  Using the Toilet? N N  In the past six months, have you accidently leaked urine? N N  Do you have problems with loss of bowel control? N N  Managing your Medications? N N  Managing your Finances? N N  Housekeeping or managing your Housekeeping? N N  Some recent data might be hidden    Patient Care Team: [name], MD as PCP - General (Internal Medicine) Clarene Duke, Karma Lew, RN as Case Manager  Indicate any recent Medical  Services you may have received from other than [facility] providers in the past year (date may be approximate).     Assessment:   This is a routine wellness examination for [place].  Hearing/Vision screen  Hearing Screening   125Hz  250Hz  500Hz  1000Hz  2000Hz  3000Hz  4000Hz  6000Hz  8000Hz   Right ear:           Left ear:           Vision Screening Comments: Regular eye exams, [facility]  Dietary issues and exercise activities discussed: Current Exercise Habits: Home exercise routine, Type of exercise: Other - see comments (walks the stairs), Time (Minutes): 20, Frequency (Times/Week):  6, Weekly Exercise (Minutes/Week): 120  Goals Addressed            This Visit's Progress   . Patient Stated       12/14/2020, no goals      Depression Screen PHQ 2/9 Scores 12/14/2020 12/16/2019 03/17/2019 12/16/2018 12/01/2018 08/24/2018  PHQ - 2 Score 0 0 0 0 0 0  PHQ- 9 Score - 0 - - 0 -    Fall Risk Fall Risk  12/14/2020 12/16/2019 07/15/2019 03/17/2019 12/09/2018  Falls in the past year? 0 0 0 0 0  Risk for fall due to : Medication side effect Medication side effect - - -  Follow up Falls evaluation completed;Education provided;Falls prevention discussed Falls evaluation completed;Education provided;Falls prevention discussed - - -    FALL RISK PREVENTION PERTAINING TO THE HOME:  Any stairs in or around the home? Yes  If so, are there any without handrails? No  Home free of loose throw rugs in walkways, pet beds, electrical cords, etc? Yes  Adequate lighting in your home to reduce risk of falls? Yes   ASSISTIVE DEVICES UTILIZED TO PREVENT FALLS:  Life alert? No  Use of a cane, walker or w/c? No  Grab bars in the bathroom? No  Shower chair or bench in shower? Yes  Elevated toilet seat or a handicapped toilet? No   TIMED UP AND GO:  Was the test performed? No .     Gait steady and fast without use of assistive device  Cognitive Function:     6CIT Screen 12/14/2020 12/16/2019 12/01/2018  What  Year? 0 points 0 points 0 points  What month? 0 points 0 points -  What time? 0 points 0 points 0 points  Count back from 20 0 points 0 points 0 points  Months in reverse 0 points 0 points 0 points  Repeat phrase 8 points 2 points 0 points  Total Score 8 2 -    Immunizations Immunization History  Administered Date(s) Administered  . Fluad Quad(high Dose 65+) 04/19/2020  . Influenza, High Dose Seasonal PF 03/17/2019  . Influenza-Unspecified 04/28/2014  . PFIZER(Purple Top)SARS-COV-2 Vaccination 09/27/2019, 10/26/2019, 04/28/2020, 11/10/2020  . Pneumococcal Polysaccharide-23 07/15/2019  . Tdap 07/16/2019    TDAP status: Up to date  Flu Vaccine status: Up to date  Pneumococcal vaccine status: Up to date  Covid-19 vaccine status: Completed vaccines  Qualifies for Shingles Vaccine? Yes   Zostavax completed No   Shingrix Completed?: No.    Education has been provided regarding the importance of this vaccine. Patient has been advised to call insurance company to determine out of pocket expense if they have not yet received this vaccine. Advised may also receive vaccine at local pharmacy or Health Dept. Verbalized acceptance and understanding.  Screening Tests Health Maintenance  Topic Date Due  . FOOT EXAM  12/15/2020  . HEMOGLOBIN A1C  02/20/2021  . INFLUENZA VACCINE  02/26/2021  . OPHTHALMOLOGY EXAM  10/05/2021  . TETANUS/TDAP  07/15/2029  . DEXA SCAN  Completed  . COVID-19 Vaccine  Completed  . PNA vac Low Risk Adult  Completed  . HPV VACCINES  Aged Out    Health Maintenance  There are no preventive care reminders to display for this patient.  Colorectal cancer screening: No longer required.   Mammogram status: No longer required due to age.  Bone Density status: Completed 12/15/2017.   Lung Cancer Screening: (Low Dose CT Chest recommended if Age 77-80 years, 30 pack-year currently smoking OR have quit  w/in 15years.) does not qualify.   Lung Cancer Screening  Referral: no  Additional Screening:  Hepatitis C Screening: does not qualify;  Vision Screening: Recommended annual ophthalmology exams for early detection of glaucoma and other disorders of the eye. Is the patient up to date with their annual eye exam?  Yes  Who is the provider or what is the name of the office in which the patient attends annual eye exams? [facility] If pt is not established with a provider, would they like to be referred to a provider to establish care? No .   Dental Screening: Recommended annual dental exams for proper oral hygiene  Community Resource Referral / Chronic Care Management: CRR required this visit?  No   CCM required this visit?  No      Plan:     I have personally reviewed and noted the following in the patient's chart:   . Medical and social history . Use of alcohol, tobacco or illicit drugs  . Current medications and supplements including opioid prescriptions.  . Functional ability and status . Nutritional status . Physical activity . Advanced directives . List of other physicians . Hospitalizations, surgeries, and ER visits in previous 12 months . Vitals . Screenings to include cognitive, depression, and falls . Referrals and appointments  In addition, I have reviewed and discussed with patient certain preventive protocols, quality metrics, and best practice recommendations. A written personalized care plan for preventive services as well as general preventive health recommendations were provided to patient.     Barb Merino, LPN   10/09/9700   Nurse Notes:

## 2020-12-14 NOTE — Patient Instructions (Signed)
Jean [name] , Thank you for taking time to come for your Medicare Wellness Visit. I appreciate your ongoing commitment to your health goals. Please review the following plan we discussed and let me know if I can assist you in the future.   Screening recommendations/referrals: Colonoscopy: not required Mammogram: not required Bone Density: completed 10/27/2017 Recommended yearly ophthalmology/optometry visit for glaucoma screening and checkup Recommended yearly dental visit for hygiene and checkup  Vaccinations: Influenza vaccine: completed 04/19/2020, due 02/26/2021 Pneumococcal vaccine: completed 07/15/2019 Tdap vaccine: completed 07/16/2019, due 07/15/2029 Shingles vaccine: discussed   Covid-19: 11/10/2020, 04/28/2020, 10/26/2019, 09/27/2019  Advanced directives: Advance directive discussed with you today. Even though you declined this today please call our office should you change your mind and we can give you the proper paperwork for you to fill out.  Conditions/risks identified: none  Next appointment: Follow up in one year for your annual wellness visit    Preventive Care 65 Years and Older, Female Preventive care refers to lifestyle choices and visits with your health care provider that can promote health and wellness. What does preventive care include?  A yearly physical exam. This is also called an annual well check.  Dental exams once or twice a year.  Routine eye exams. Ask your health care provider how often you should have your eyes checked.  Personal lifestyle choices, including:  Daily care of your teeth and gums.  Regular physical activity.  Eating a healthy diet.  Avoiding tobacco and drug use.  Limiting alcohol use.  Practicing safe sex.  Taking low-dose aspirin every day.  Taking vitamin and mineral supplements as recommended by your health care provider. What happens during an annual well check? The services and screenings done by your health care provider  during your annual well check will depend on your age, overall health, lifestyle risk factors, and family history of disease. Counseling  Your health care provider may ask you questions about your:  Alcohol use.  Tobacco use.  Drug use.  Emotional well-being.  Home and relationship well-being.  Sexual activity.  Eating habits.  History of falls.  Memory and ability to understand (cognition).  Work and work Astronomer.  Reproductive health. Screening  You may have the following tests or measurements:  Height, weight, and BMI.  Blood pressure.  Lipid and cholesterol levels. These may be checked every 5 years, or more frequently if you are over 41 years old.  Skin check.  Lung cancer screening. You may have this screening every year starting at age 75 if you have a 30-pack-year history of smoking and currently smoke or have quit within the past 15 years.  Fecal occult blood test (FOBT) of the stool. You may have this test every year starting at age 81.  Flexible sigmoidoscopy or colonoscopy. You may have a sigmoidoscopy every 5 years or a colonoscopy every 10 years starting at age 38.  Hepatitis C blood test.  Hepatitis B blood test.  Sexually transmitted disease (STD) testing.  Diabetes screening. This is done by checking your blood sugar (glucose) after you have not eaten for a while (fasting). You may have this done every 1-3 years.  Bone density scan. This is done to screen for osteoporosis. You may have this done starting at age 37.  Mammogram. This may be done every 1-2 years. Talk to your health care provider about how often you should have regular mammograms. Talk with your health care provider about your test results, treatment options, and if necessary, the need  for more tests. Vaccines  Your health care provider may recommend certain vaccines, such as:  Influenza vaccine. This is recommended every year.  Tetanus, diphtheria, and acellular pertussis  (Tdap, Td) vaccine. You may need a Td booster every 10 years.  Zoster vaccine. You may need this after age 34.  Pneumococcal 13-valent conjugate (PCV13) vaccine. One dose is recommended after age 76.  Pneumococcal polysaccharide (PPSV23) vaccine. One dose is recommended after age 52. Talk to your health care provider about which screenings and vaccines you need and how often you need them. This information is not intended to replace advice given to you by your health care provider. Make sure you discuss any questions you have with your health care provider. Document Released: 08/11/2015 Document Revised: 04/03/2016 Document Reviewed: 05/16/2015 Elsevier Interactive Patient Education  2017 [employer] Prevention in the Home Falls can cause injuries. They can happen to people of all ages. There are many things you can do to make your home safe and to help prevent falls. What can I do on the outside of my home?  Regularly fix the edges of walkways and driveways and fix any cracks.  Remove anything that might make you trip as you walk through a door, such as a raised step or threshold.  Trim any bushes or trees on the path to your home.  Use bright outdoor lighting.  Clear any walking paths of anything that might make someone trip, such as rocks or tools.  Regularly check to see if handrails are loose or broken. Make sure that both sides of any steps have handrails.  Any raised decks and porches should have guardrails on the edges.  Have any leaves, snow, or ice cleared regularly.  Use sand or salt on walking paths during winter.  Clean up any spills in your garage right away. This includes oil or grease spills. What can I do in the bathroom?  Use night lights.  Install grab bars by the toilet and in the tub and shower. Do not use towel bars as grab bars.  Use non-skid mats or decals in the tub or shower.  If you need to sit down in the shower, use a plastic, non-slip  stool.  Keep the floor dry. Clean up any water that spills on the floor as soon as it happens.  Remove soap buildup in the tub or shower regularly.  Attach bath mats securely with double-sided non-slip rug tape.  Do not have throw rugs and other things on the floor that can make you trip. What can I do in the bedroom?  Use night lights.  Make sure that you have a light by your bed that is easy to reach.  Do not use any sheets or blankets that are too big for your bed. They should not hang down onto the floor.  Have a firm chair that has side arms. You can use this for support while you get dressed.  Do not have throw rugs and other things on the floor that can make you trip. What can I do in the kitchen?  Clean up any spills right away.  Avoid walking on wet floors.  Keep items that you use a lot in easy-to-reach places.  If you need to reach something above you, use a strong step stool that has a grab bar.  Keep electrical cords out of the way.  Do not use floor polish or wax that makes floors slippery. If you must use wax, use  non-skid floor wax.  Do not have throw rugs and other things on the floor that can make you trip. What can I do with my stairs?  Do not leave any items on the stairs.  Make sure that there are handrails on both sides of the stairs and use them. Fix handrails that are broken or loose. Make sure that handrails are as long as the stairways.  Check any carpeting to make sure that it is firmly attached to the stairs. Fix any carpet that is loose or worn.  Avoid having throw rugs at the top or bottom of the stairs. If you do have throw rugs, attach them to the floor with carpet tape.  Make sure that you have a light switch at the top of the stairs and the bottom of the stairs. If you do not have them, ask someone to add them for you. What else can I do to help prevent falls?  Wear shoes that:  Do not have high heels.  Have rubber bottoms.  Are  comfortable and fit you well.  Are closed at the toe. Do not wear sandals.  If you use a stepladder:  Make sure that it is fully opened. Do not climb a closed stepladder.  Make sure that both sides of the stepladder are locked into place.  Ask someone to hold it for you, if possible.  Clearly mark and make sure that you can see:  Any grab bars or handrails.  First and last steps.  Where the edge of each step is.  Use tools that help you move around (mobility aids) if they are needed. These include:  Canes.  Walkers.  Scooters.  Crutches.  Turn on the lights when you go into a dark area. Replace any light bulbs as soon as they burn out.  Set up your furniture so you have a clear path. Avoid moving your furniture around.  If any of your floors are uneven, fix them.  If there are any pets around you, be aware of where they are.  Review your medicines with your doctor. Some medicines can make you feel dizzy. This can increase your chance of falling. Ask your doctor what other things that you can do to help prevent falls. This information is not intended to replace advice given to you by your health care provider. Make sure you discuss any questions you have with your health care provider. Document Released: 05/11/2009 Document Revised: 12/21/2015 Document Reviewed: 08/19/2014 Elsevier Interactive Patient Education  2017 [employer].

## 2020-12-21 ENCOUNTER — Other Ambulatory Visit: Payer: Self-pay

## 2020-12-21 ENCOUNTER — Encounter: Payer: Self-pay | Admitting: Internal Medicine

## 2020-12-21 ENCOUNTER — Ambulatory Visit (INDEPENDENT_AMBULATORY_CARE_PROVIDER_SITE_OTHER): Payer: Medicare Other | Admitting: Internal Medicine

## 2020-12-21 VITALS — BP 130/76 | HR 90 | Temp 98.1°F | Ht 63.6 in | Wt 150.6 lb

## 2020-12-21 DIAGNOSIS — E1122 Type 2 diabetes mellitus with diabetic chronic kidney disease: Secondary | ICD-10-CM

## 2020-12-21 DIAGNOSIS — I129 Hypertensive chronic kidney disease with stage 1 through stage 4 chronic kidney disease, or unspecified chronic kidney disease: Secondary | ICD-10-CM

## 2020-12-21 DIAGNOSIS — Z23 Encounter for immunization: Secondary | ICD-10-CM | POA: Diagnosis not present

## 2020-12-21 DIAGNOSIS — E2839 Other primary ovarian failure: Secondary | ICD-10-CM | POA: Diagnosis not present

## 2020-12-21 DIAGNOSIS — N182 Chronic kidney disease, stage 2 (mild): Secondary | ICD-10-CM | POA: Diagnosis not present

## 2020-12-21 MED ORDER — SHINGRIX 50 MCG/0.5ML IM SUSR: 0.5000 mL | Freq: Once | INTRAMUSCULAR | 0 refills | Status: AC

## 2020-12-21 NOTE — Progress Notes (Signed)
I,Tianna Badgett,acting as a Education administrator for [name], MD.,have documented all relevant documentation on the behalf of [name], MD,as directed by  [name], MD while in the presence of [name], MD.  This visit occurred during the SARS-CoV-2 public health emergency.  Safety protocols were in place, including screening questions prior to the visit, additional usage of staff PPE, and extensive cleaning of exam room while observing appropriate contact time as indicated for disinfecting solutions.  Subjective:     Patient ID: Jean [name] , female    DOB: June 21, 1941 , 80 y.o.   MRN: [id number]   Chief Complaint  Patient presents with  . Diabetes  . Hypertension    HPI  She is here today for a diabetes and blood pressure f/u.  She reports compliance with meds. She denies headaches, chest pain and shortness of breath.   Diabetes She presents for her follow-up diabetic visit. She has type 2 diabetes mellitus. Her disease course has been worsening. Pertinent negatives for diabetes include no blurred vision. There are no hypoglycemic complications. Diabetic complications include nephropathy. Risk factors for coronary artery disease include diabetes mellitus, dyslipidemia, hypertension and post-menopausal. Current diabetic treatment includes oral agent (monotherapy). She is compliant with treatment most of the time. She is following a diabetic diet. She participates in exercise intermittently. Her home blood glucose trend is decreasing steadily. An ACE inhibitor/angiotensin II receptor blocker is being taken. Eye exam is not current.  Hypertension This is a chronic problem. The current episode started more than 1 year ago. The problem has been gradually improving since onset. The problem is controlled. Pertinent negatives include no blurred vision. Risk factors for coronary artery disease include diabetes mellitus and dyslipidemia. Past treatments include ACE inhibitors. The  current treatment provides moderate improvement. Hypertensive end-organ damage includes kidney disease.     Past Medical History:  Diagnosis Date  . Blood transfusion without reported diagnosis    No reaction   . Diabetes mellitus without complication ([place])   . Hypertension      Family History  Problem Relation Age of Onset  . Breast cancer Sister   . Breast cancer Sister   . Healthy Mother   . Healthy Father      Current Outpatient Medications:  .  Zoster Vaccine Adjuvanted [facility]) injection, Inject 0.5 mLs into the muscle once for 1 dose., Disp: 0.5 mL, Rfl: 0 .  ACCU-CHEK AVIVA PLUS test strip, CHECK BLOOD SUGAR TWICE  DAILY AS DIRECTED, Disp: 200 strip, Rfl: 3 .  amLODipine (NORVASC) 10 MG tablet, TAKE 1 TABLET BY MOUTH  DAILY, Disp: 90 tablet, Rfl: 3 .  aspirin EC 81 MG tablet, Take 81 mg by mouth daily., Disp: , Rfl:  .  benazepril (LOTENSIN) 20 MG tablet, TAKE 1 TABLET BY MOUTH  DAILY, Disp: 90 tablet, Rfl: 3 .  cholecalciferol (VITAMIN D) 1000 UNITS tablet, Take 1,000 Units by mouth daily., Disp: , Rfl:  .  cinacalcet (SENSIPAR) 30 MG tablet, Take 30 mg by mouth daily., Disp: , Rfl:  .  rosuvastatin (CRESTOR) 20 MG tablet, TAKE 1 TABLET BY MOUTH  DAILY, Disp: 90 tablet, Rfl: 3 .  TRADJENTA 5 MG TABS tablet, TAKE ONE TABLET BY MOUTH ONCE DAILY, Disp: 90 tablet, Rfl: 5   Allergies  Allergen Reactions  . Penicillins Anaphylaxis     Review of Systems  Constitutional: Negative.   Eyes: Negative for blurred vision.  Respiratory: Negative.   Cardiovascular: Negative.   Gastrointestinal:  Negative.   Neurological: Negative.      Today's Vitals   12/21/20 1131  BP: 130/76  Pulse: 90  Temp: 98.1 F (36.7 C)  TempSrc: Oral  Weight: 150 lb 9.6 oz (68.3 kg)  Height: 5' 3.6" (1.615 m)   Body mass index is 26.18 kg/m.   Objective:  Physical Exam Vitals and nursing note reviewed.  Constitutional:      Appearance: Normal appearance.  HENT:     Head:  Normocephalic and atraumatic.     Nose:     Comments: Masked     Mouth/Throat:     Comments: Masked  Cardiovascular:     Rate and Rhythm: Normal rate and regular rhythm.     Heart sounds: Normal heart sounds.  Pulmonary:     Effort: Pulmonary effort is normal.     Breath sounds: Normal breath sounds.  Skin:    General: Skin is warm.  Neurological:     General: No focal deficit present.     Mental Status: She is alert.  Psychiatric:        Mood and Affect: Mood normal.        Behavior: Behavior normal.         Assessment And Plan:     1. Type 2 diabetes mellitus with stage 2 chronic kidney disease, without long-term current use of insulin (HCC) Comments: Chronic, I will check labs as listed below. Importance of dietary compliance was discussed w/ pt. She is advised to c/w BS log. She will f/u in 3 months.  - BMP8+EGFR - Hemoglobin A1c - Lipid panel  2. Hypertensive nephropathy Comments: Chronic, controlled.  She is encouraged to follow low sodium diet. She is encouraged to increase her walking to 30 minutes five days per week.  3. Estrogen deficiency Comments: I will refer her for bone density. Encouraged to continue with vitamin D/Calcium supplementation and weight-bearing exercises.  - DG Bone Density; Future  4. Immunization due Comments: I will send rx Shingrix to her local pharmacy.    Patient was given opportunity to ask questions. Patient verbalized understanding of the plan and was able to repeat key elements of the plan. All questions were answered to their satisfaction.   I, [name], MD, have reviewed all documentation for this visit. The documentation on 12/21/20 for the exam, diagnosis, procedures, and orders are all accurate and complete.   IF YOU HAVE BEEN REFERRED TO A SPECIALIST, IT MAY TAKE 1-2 WEEKS TO SCHEDULE/PROCESS THE REFERRAL. IF YOU HAVE NOT HEARD FROM US/SPECIALIST IN TWO WEEKS, PLEASE GIVE [place] A CALL AT [phone].   THE PATIENT  IS ENCOURAGED TO PRACTICE SOCIAL DISTANCING DUE TO THE COVID-19 PANDEMIC.

## 2020-12-21 NOTE — Patient Instructions (Signed)
Diabetes Mellitus Basics  Diabetes mellitus, or diabetes, is a long-term (chronic) disease. It occurs when the body does not properly use sugar (glucose) that is released from food after you eat. Diabetes mellitus may be caused by one or both of these problems:  Your pancreas does not make enough of a hormone called insulin.  Your body does not react in a normal way to the insulin that it makes. Insulin lets glucose enter cells in your body. This gives you energy. If you have diabetes, glucose cannot get into cells. This causes high blood glucose (hyperglycemia). How to treat and manage diabetes You may need to take insulin or other diabetes medicines daily to keep your glucose in balance. If you are prescribed insulin, you will learn how to give yourself insulin by injection. You may need to adjust the amount of insulin you take based on the foods that you eat. You will need to check your blood glucose levels using a glucose monitor as told by your health care provider. The readings can help determine if you have low or high blood glucose. Generally, you should have these blood glucose levels:  Before meals (preprandial): 80-130 mg/dL (4.4-7.2 mmol/L).  After meals (postprandial): below 180 mg/dL (10 mmol/L).  Hemoglobin A1c (HbA1c) level: less than 7%. Your health care provider will set treatment goals for you. Keep all follow-up visits. This is important. Follow these instructions at home: Diabetes medicines Take your diabetes medicines every day as told by your health care provider. List your diabetes medicines here:  Name of medicine: ______________________________ ? Amount (dose): _______________ Time (a.m./p.m.): _______________ Notes: ___________________________________  Name of medicine: ______________________________ ? Amount (dose): _______________ Time (a.m./p.m.): _______________ Notes: ___________________________________  Name of medicine:  ______________________________ ? Amount (dose): _______________ Time (a.m./p.m.): _______________ Notes: ___________________________________ Insulin If you use insulin, list the types of insulin you use here:  Insulin type: ______________________________ ? Amount (dose): _______________ Time (a.m./p.m.): _______________Notes: ___________________________________  Insulin type: ______________________________ ? Amount (dose): _______________ Time (a.m./p.m.): _______________ Notes: ___________________________________  Insulin type: ______________________________ ? Amount (dose): _______________ Time (a.m./p.m.): _______________ Notes: ___________________________________  Insulin type: ______________________________ ? Amount (dose): _______________ Time (a.m./p.m.): _______________ Notes: ___________________________________  Insulin type: ______________________________ ? Amount (dose): _______________ Time (a.m./p.m.): _______________ Notes: ___________________________________ Managing blood glucose Check your blood glucose levels using a glucose monitor as told by your health care provider. Write down the times that you check your glucose levels here:  Time: _______________ Notes: ___________________________________  Time: _______________ Notes: ___________________________________  Time: _______________ Notes: ___________________________________  Time: _______________ Notes: ___________________________________  Time: _______________ Notes: ___________________________________  Time: _______________ Notes: ___________________________________   Low blood glucose Low blood glucose (hypoglycemia) is when glucose is at or below 70 mg/dL (3.9 mmol/L). Symptoms may include:  Feeling: ? Hungry. ? Sweaty and clammy. ? Irritable or easily upset. ? Dizzy. ? Sleepy.  Having: ? A fast heartbeat. ? A headache. ? A change in your vision. ? Numbness around the mouth, lips, or  tongue.  Having trouble with: ? Moving (coordination). ? Sleeping. Treating low blood glucose To treat low blood glucose, eat or drink something containing sugar right away. If you can think clearly and swallow safely, follow the 15:15 rule:  Take 15 grams of a fast-acting carb (carbohydrate), as told by your health care provider.  Some fast-acting carbs are: ? Glucose tablets: take 3-4 tablets. ? Hard candy: eat 3-5 pieces. ? Fruit juice: drink 4 oz (120 mL). ? Regular (not diet) soda: drink 4-6 oz (120-180 mL). ? Honey or sugar:   eat 1 Tbsp (15 mL).  Check your blood glucose levels 15 minutes after you take the carb.  If your glucose is still at or below 70 mg/dL (3.9 mmol/L), take 15 grams of a carb again.  If your glucose does not go above 70 mg/dL (3.9 mmol/L) after 3 tries, get help right away.  After your glucose goes back to normal, eat a meal or a snack within 1 hour. Treating very low blood glucose If your glucose is at or below 54 mg/dL (3 mmol/L), you have very low blood glucose (severe hypoglycemia). This is an emergency. Do not wait to see if the symptoms will go away. Get medical help right away. Call your local emergency services (911 in the [place] Do not drive yourself to the hospital. Questions to ask your health care provider  Should I talk with a diabetes educator?  What equipment will I need to care for myself at home?  What diabetes medicines do I need? When should I take them?  How often do I need to check my blood glucose levels?  What number can I call if I have questions?  When is my follow-up visit?  Where can I find a support group for people with diabetes? Where to find more information  American Diabetes Association: www.diabetes.org  Association of Diabetes Care and Education Specialists: www.diabeteseducator.org Contact a health care provider if:  Your blood glucose is at or above 240 mg/dL (13.3 mmol/L) for 2 days in a row.  You have  been sick or have had a fever for 2 days or more, and you are not getting better.  You have any of these problems for more than 6 hours: ? You cannot eat or drink. ? You feel nauseous. ? You vomit. ? You have diarrhea. Get help right away if:  Your blood glucose is lower than 54 mg/dL (3 mmol/L).  You get confused.  You have trouble thinking clearly.  You have trouble breathing. These symptoms may represent a serious problem that is an emergency. Do not wait to see if the symptoms will go away. Get medical help right away. Call your local emergency services (911 in the [place] Do not drive yourself to the hospital. Summary  Diabetes mellitus is a chronic disease that occurs when the body does not properly use sugar (glucose) that is released from food after you eat.  Take insulin and diabetes medicines as told.  Check your blood glucose every day, as often as told.  Keep all follow-up visits. This is important. This information is not intended to replace advice given to you by your health care provider. Make sure you discuss any questions you have with your health care provider. Document Revised: 11/16/2019 Document Reviewed: 11/16/2019 Elsevier Patient Education  2021 [employer] Inc.  

## 2020-12-22 LAB — BMP8+EGFR
BUN/Creatinine Ratio: 12 (ref 12–28)
BUN: 12 mg/dL (ref 8–27)
CO2: 22 mmol/L (ref 20–29)
Calcium: 10.2 mg/dL (ref 8.7–10.3)
Chloride: 98 mmol/L (ref 96–106)
Creatinine, Ser: 1 mg/dL (ref 0.57–1.00)
Glucose: 90 mg/dL (ref 65–99)
Potassium: 4.7 mmol/L (ref 3.5–5.2)
Sodium: 138 mmol/L (ref 134–144)
eGFR: 57 mL/min/{1.73_m2} — ABNORMAL LOW (ref >59)

## 2020-12-22 LAB — HEMOGLOBIN A1C
Est. average glucose Bld gHb Est-mCnc: 143 mg/dL
Hgb A1c MFr Bld: 6.6 % — ABNORMAL HIGH (ref 4.8–5.6)

## 2020-12-22 LAB — LIPID PANEL
Chol/HDL Ratio: 3.1 ratio (ref 0.0–4.4)
Cholesterol, Total: 198 mg/dL (ref 100–199)
HDL: 63 mg/dL (ref >39)
LDL Chol Calc (NIH): 106 mg/dL — ABNORMAL HIGH (ref 0–99)
Triglycerides: 171 mg/dL — ABNORMAL HIGH (ref 0–149)
VLDL Cholesterol Cal: 29 mg/dL (ref 5–40)

## 2020-12-26 ENCOUNTER — Other Ambulatory Visit: Payer: Self-pay | Admitting: Internal Medicine

## 2021-01-12 ENCOUNTER — Encounter: Payer: Self-pay | Admitting: Internal Medicine

## 2021-01-15 ENCOUNTER — Ambulatory Visit (INDEPENDENT_AMBULATORY_CARE_PROVIDER_SITE_OTHER): Payer: Medicare Other | Admitting: Internal Medicine

## 2021-01-15 ENCOUNTER — Encounter: Payer: Self-pay | Admitting: Internal Medicine

## 2021-01-15 ENCOUNTER — Other Ambulatory Visit: Payer: Self-pay

## 2021-01-15 VITALS — BP 134/72 | HR 97 | Temp 98.3°F | Ht 64.6 in | Wt 151.4 lb

## 2021-01-15 DIAGNOSIS — Z0000 Encounter for general adult medical examination without abnormal findings: Secondary | ICD-10-CM

## 2021-01-15 DIAGNOSIS — I129 Hypertensive chronic kidney disease with stage 1 through stage 4 chronic kidney disease, or unspecified chronic kidney disease: Secondary | ICD-10-CM

## 2021-01-15 DIAGNOSIS — N182 Chronic kidney disease, stage 2 (mild): Secondary | ICD-10-CM

## 2021-01-15 DIAGNOSIS — E1122 Type 2 diabetes mellitus with diabetic chronic kidney disease: Secondary | ICD-10-CM | POA: Diagnosis not present

## 2021-01-15 DIAGNOSIS — Z880 Allergy status to penicillin: Secondary | ICD-10-CM | POA: Diagnosis not present

## 2021-01-15 LAB — POCT URINALYSIS DIPSTICK
Bilirubin, UA: NEGATIVE
Blood, UA: NEGATIVE
Glucose, UA: NEGATIVE
Ketones, UA: NEGATIVE
Leukocytes, UA: NEGATIVE
Nitrite, UA: NEGATIVE
Protein, UA: NEGATIVE
Spec Grav, UA: 1.01 (ref 1.010–1.025)
Urobilinogen, UA: 0.2 E.U./dL
pH, UA: 6 (ref 5.0–8.0)

## 2021-01-15 LAB — POCT UA - MICROALBUMIN
Creatinine, POC: 50 mg/dL
Microalbumin Ur, POC: 30 mg/L

## 2021-01-15 NOTE — Patient Instructions (Signed)
Health Maintenance, Female Adopting a healthy lifestyle and getting preventive care are important in promoting health and wellness. Ask your health care provider about: The right schedule for you to have regular tests and exams. Things you can do on your own to prevent diseases and keep yourself healthy. What should I know about diet, weight, and exercise? Eat a healthy diet  Eat a diet that includes plenty of vegetables, fruits, low-fat dairy products, and lean protein. Do not eat a lot of foods that are high in solid fats, added sugars, or sodium.  Maintain a healthy weight Body mass index (BMI) is used to identify weight problems. It estimates body fat based on height and weight. Your health care provider can help determineyour BMI and help you achieve or maintain a healthy weight. Get regular exercise Get regular exercise. This is one of the most important things you can do for your health. Most adults should: Exercise for at least 150 minutes each week. The exercise should increase your heart rate and make you sweat (moderate-intensity exercise). Do strengthening exercises at least twice a week. This is in addition to the moderate-intensity exercise. Spend less time sitting. Even light physical activity can be beneficial. Watch cholesterol and blood lipids Have your blood tested for lipids and cholesterol at 81 years of age, then havethis test every 5 years. Have your cholesterol levels checked more often if: Your lipid or cholesterol levels are high. You are older than 81 years of age. You are at high risk for heart disease. What should I know about cancer screening? Depending on your health history and family history, you may need to have cancer screening at various ages. This may include screening for: Breast cancer. Cervical cancer. Colorectal cancer. Skin cancer. Lung cancer. What should I know about heart disease, diabetes, and high blood pressure? Blood pressure and heart  disease High blood pressure causes heart disease and increases the risk of stroke. This is more likely to develop in people who have high blood pressure readings, are of African descent, or are overweight. Have your blood pressure checked: Every 3-5 years if you are 18-39 years of age. Every year if you are 40 years old or older. Diabetes Have regular diabetes screenings. This checks your fasting blood sugar level. Have the screening done: Once every three years after age 40 if you are at a normal weight and have a low risk for diabetes. More often and at a younger age if you are overweight or have a high risk for diabetes. What should I know about preventing infection? Hepatitis B If you have a higher risk for hepatitis B, you should be screened for this virus. Talk with your health care provider to find out if you are at risk forhepatitis B infection. Hepatitis C Testing is recommended for: Everyone born from 1945 through 1965. Anyone with known risk factors for hepatitis C. Sexually transmitted infections (STIs) Get screened for STIs, including gonorrhea and chlamydia, if: You are sexually active and are younger than 81 years of age. You are older than 81 years of age and your health care provider tells you that you are at risk for this type of infection. Your sexual activity has changed since you were last screened, and you are at increased risk for chlamydia or gonorrhea. Ask your health care provider if you are at risk. Ask your health care provider about whether you are at high risk for HIV. Your health care provider may recommend a prescription medicine to help   prevent HIV infection. If you choose to take medicine to prevent HIV, you should first get tested for HIV. You should then be tested every 3 months for as long as you are taking the medicine. Pregnancy If you are about to stop having your period (premenopausal) and you may become pregnant, seek counseling before you get  pregnant. Take 400 to 800 micrograms (mcg) of folic acid every day if you become pregnant. Ask for birth control (contraception) if you want to prevent pregnancy. Osteoporosis and menopause Osteoporosis is a disease in which the bones lose minerals and strength with aging. This can result in bone fractures. If you are 65 years old or older, or if you are at risk for osteoporosis and fractures, ask your health care provider if you should: Be screened for bone loss. Take a calcium or vitamin D supplement to lower your risk of fractures. Be given hormone replacement therapy (HRT) to treat symptoms of menopause. Follow these instructions at home: Lifestyle Do not use any products that contain nicotine or tobacco, such as cigarettes, e-cigarettes, and chewing tobacco. If you need help quitting, ask your health care provider. Do not use street drugs. Do not share needles. Ask your health care provider for help if you need support or information about quitting drugs. Alcohol use Do not drink alcohol if: Your health care provider tells you not to drink. You are pregnant, may be pregnant, or are planning to become pregnant. If you drink alcohol: Limit how much you use to 0-1 drink a day. Limit intake if you are breastfeeding. Be aware of how much alcohol is in your drink. In the [place] one drink equals one 12 oz bottle of beer (355 mL), one 5 oz glass of wine (148 mL), or one 1 oz glass of hard liquor (44 mL). General instructions Schedule regular health, dental, and eye exams. Stay current with your vaccines. Tell your health care provider if: You often feel depressed. You have ever been abused or do not feel safe at home. Summary Adopting a healthy lifestyle and getting preventive care are important in promoting health and wellness. Follow your health care provider's instructions about healthy diet, exercising, and getting tested or screened for diseases. Follow your health care provider's  instructions on monitoring your cholesterol and blood pressure. This information is not intended to replace advice given to you by your health care provider. Make sure you discuss any questions you have with your healthcare provider. Document Revised: 07/08/2018 Document Reviewed: 07/08/2018 Elsevier Patient Education  2022 [employer] Inc.  

## 2021-01-15 NOTE — Progress Notes (Signed)
I,Katawbba Wiggins,acting as a Neurosurgeon for Gwynneth Aliment, MD.,have documented all relevant documentation on the behalf of Gwynneth Aliment, MD,as directed by  Gwynneth Aliment, MD while in the presence of Gwynneth Aliment, MD.  This visit occurred during the SARS-CoV-2 public health emergency.  Safety protocols were in place, including screening questions prior to the visit, additional usage of staff PPE, and extensive cleaning of exam room while observing appropriate contact time as indicated for disinfecting solutions.  Subjective:     Patient ID: Jean [name] , female    DOB: [birth year] , 80 y.o.   MRN: [id number]   Chief Complaint  Patient presents with   Annual Exam   Diabetes   Hypertension    HPI  She is here today for a full physical exam. She is no longer followed by GYN. She has no specific concerns or complaints at this time. She reports she is exercising regularly - six days a week, 30-45 minutes each session.   Diabetes She presents for her follow-up diabetic visit. She has type 2 diabetes mellitus. Her disease course has been worsening. There are no hypoglycemic associated symptoms. Pertinent negatives for diabetes include no blurred vision and no chest pain. There are no hypoglycemic complications. Diabetic complications include nephropathy. Risk factors for coronary artery disease include diabetes mellitus, dyslipidemia, hypertension and post-menopausal. Current diabetic treatment includes oral agent (monotherapy). She is compliant with treatment most of the time. She is following a diabetic diet. She participates in exercise intermittently. Her home blood glucose trend is decreasing steadily. An ACE inhibitor/angiotensin II receptor blocker is being taken. Eye exam is not current.  Hypertension This is a chronic problem. The current episode started more than 1 year ago. The problem has been gradually improving since onset. The problem is controlled. Pertinent negatives include  no blurred vision, chest pain, palpitations or shortness of breath. Risk factors for coronary artery disease include diabetes mellitus and dyslipidemia. Past treatments include ACE inhibitors. The current treatment provides moderate improvement. Hypertensive end-organ damage includes kidney disease.    Past Medical History:  Diagnosis Date   Blood transfusion without reported diagnosis    No reaction    Diabetes mellitus without complication (HCC)    Hypertension      Family History  Problem Relation Age of Onset   Breast cancer Sister    Breast cancer Sister    Healthy Mother    Healthy Father      Current Outpatient Medications:    ACCU-CHEK AVIVA PLUS test strip, CHECK BLOOD SUGAR TWICE  DAILY AS DIRECTED, Disp: 200 strip, Rfl: 3   amLODipine (NORVASC) 10 MG tablet, TAKE 1 TABLET BY MOUTH  DAILY, Disp: 90 tablet, Rfl: 3   aspirin EC 81 MG tablet, Take 81 mg by mouth daily., Disp: , Rfl:    benazepril (LOTENSIN) 20 MG tablet, TAKE 1 TABLET BY MOUTH  DAILY, Disp: 90 tablet, Rfl: 3   cholecalciferol (VITAMIN D) 1000 UNITS tablet, Take 1,000 Units by mouth daily., Disp: , Rfl:    cinacalcet (SENSIPAR) 30 MG tablet, Take 30 mg by mouth daily., Disp: , Rfl:    rosuvastatin (CRESTOR) 20 MG tablet, TAKE 1 TABLET BY MOUTH  DAILY, Disp: 90 tablet, Rfl: 3   TRADJENTA 5 MG TABS tablet, TAKE ONE TABLET BY MOUTH ONCE DAILY, Disp: 90 tablet, Rfl: 5   Allergies  Allergen Reactions   Penicillins Anaphylaxis      The patient states she uses post menopausal status for  birth control. Last LMP was No LMP recorded. Patient has had a hysterectomy.. Negative for Dysmenorrhea. Negative for: breast discharge, breast lump(s), breast pain and breast self exam. Associated symptoms include abnormal vaginal bleeding. Pertinent negatives include abnormal bleeding (hematology), anxiety, decreased libido, depression, difficulty falling sleep, dyspareunia, history of infertility, nocturia, sexual dysfunction,  sleep disturbances, urinary incontinence, urinary urgency, vaginal discharge and vaginal itching. Diet regular.The patient states her exercise level is  moderate.   . The patient's tobacco use is:  Social History   Tobacco Use  Smoking Status Never  Smokeless Tobacco Never  . She has been exposed to passive smoke. The patient's alcohol use is:  Social History   Substance and Sexual Activity  Alcohol Use No    Review of Systems  Constitutional: Negative.   HENT: Negative.    Eyes: Negative.  Negative for blurred vision.  Respiratory: Negative.  Negative for shortness of breath.   Cardiovascular: Negative.  Negative for chest pain and palpitations.  Gastrointestinal: Negative.   Endocrine: Negative.   Genitourinary: Negative.   Musculoskeletal: Negative.   Skin: Negative.   Allergic/Immunologic: Negative.   Neurological: Negative.   Hematological: Negative.   Psychiatric/Behavioral: Negative.      Today's Vitals   01/15/21 1123  BP: 134/72  Pulse: 97  Temp: 98.3 F (36.8 C)  TempSrc: Oral  Weight: 151 lb 6.4 oz (68.7 kg)  Height: 5' 4.6" (1.641 m)  PainSc: 0-No pain   Body mass index is 25.51 kg/m.  Wt Readings from Last 3 Encounters:  01/15/21 151 lb 6.4 oz (68.7 kg)  12/21/20 150 lb 9.6 oz (68.3 kg)  12/14/20 150 lb 6.4 oz (68.2 kg)    BP Readings from Last 3 Encounters:  01/15/21 134/72  12/21/20 130/76  12/14/20 128/68    Objective:  Physical Exam Vitals and nursing note reviewed.  Constitutional:      Appearance: Normal appearance.  HENT:     Head: Normocephalic and atraumatic.     Right Ear: Tympanic membrane, ear canal and external ear normal.     Left Ear: Tympanic membrane, ear canal and external ear normal.     Nose:     Comments: Masked     Mouth/Throat:     Comments: Masked  Eyes:     Extraocular Movements: Extraocular movements intact.     Conjunctiva/sclera: Conjunctivae normal.     Pupils: Pupils are equal, round, and reactive to  light.  Cardiovascular:     Rate and Rhythm: Normal rate and regular rhythm.     Pulses: Normal pulses.          Dorsalis pedis pulses are 2+ on the right side and 2+ on the left side.     Heart sounds: Normal heart sounds.  Pulmonary:     Effort: Pulmonary effort is normal.     Breath sounds: Normal breath sounds.  Chest:  Breasts:    Tanner Score is 5.     Right: Normal.     Left: Normal.  Abdominal:     General: Abdomen is flat. Bowel sounds are normal.     Palpations: Abdomen is soft.  Genitourinary:    Comments: deferred Musculoskeletal:        General: Normal range of motion.     Cervical back: Normal range of motion and neck supple.     Right lower leg: No edema.     Left lower leg: No edema.  Feet:     Right foot:  Protective Sensation: 5 sites tested.  5 sites sensed.     Skin integrity: Dry skin present.     Toenail Condition: Right toenails are normal.     Left foot:     Protective Sensation: 5 sites tested.  5 sites sensed.     Skin integrity: Dry skin present.     Toenail Condition: Left toenails are normal.  Skin:    General: Skin is warm and dry.  Neurological:     General: No focal deficit present.     Mental Status: She is alert and oriented to person, place, and time.  Psychiatric:        Mood and Affect: Mood normal.        Behavior: Behavior normal.        Assessment And Plan:     1. Routine general medical examination at health care facility Comments: A full exam was performed. Importance of monthly self breast exams was discussed with the patient. Marland KitchenPATIENT IS ADVISED TO GET 30-45 MINUTES REGULAR EXERCISE NO LESS THAN FOUR TO FIVE DAYS PER WEEK - BOTH WEIGHTBEARING EXERCISES AND AEROBIC ARE RECOMMENDED.  PATIENT IS ADVISED TO FOLLOW A HEALTHY DIET WITH AT LEAST SIX FRUITS/VEGGIES PER DAY, DECREASE INTAKE OF RED MEAT, AND TO INCREASE FISH INTAKE TO TWO DAYS PER WEEK.  MEATS/FISH SHOULD NOT BE FRIED, BAKED OR BROILED IS PREFERABLE.  IT IS ALSO  IMPORTANT TO CUT BACK ON YOUR SUGAR INTAKE. PLEASE AVOID ANYTHING WITH ADDED SUGAR, CORN SYRUP OR OTHER SWEETENERS. IF YOU MUST USE A SWEETENER, YOU CAN TRY STEVIA. IT IS ALSO IMPORTANT TO AVOID ARTIFICIALLY SWEETENERS AND DIET BEVERAGES. LASTLY, I SUGGEST WEARING SPF 50 SUNSCREEN ON EXPOSED PARTS AND ESPECIALLY WHEN IN THE DIRECT SUNLIGHT FOR AN EXTENDED PERIOD OF TIME.  PLEASE AVOID FAST FOOD RESTAURANTS AND INCREASE YOUR WATER INTAKE.   2. Type 2 diabetes mellitus with stage 2 chronic kidney disease, without long-term current use of insulin (HCC) Diabetic foot exam was performed. I will not check any labwork today, previous labs reviewed. She will rto in Sept for her next evaluation.  I DISCUSSED WITH THE PATIENT AT LENGTH REGARDING THE GOALS OF GLYCEMIC CONTROL AND POSSIBLE LONG-TERM COMPLICATIONS.  I  ALSO STRESSED THE IMPORTANCE OF COMPLIANCE WITH HOME GLUCOSE MONITORING, DIETARY RESTRICTIONS INCLUDING AVOIDANCE OF SUGARY DRINKS/PROCESSED FOODS,  ALONG WITH REGULAR EXERCISE.  I  ALSO STRESSED THE IMPORTANCE OF ANNUAL EYE EXAMS, SELF FOOT CARE AND COMPLIANCE WITH OFFICE VISITS.  - EKG 12-Lead  3. Hypertensive nephropathy Chronic, fair control.  EKG performed, NSR w/o acute changes. She is encouraged to follow low sodium diet. I will check labs as listed below.  - POCT Urinalysis Dipstick (81002) - POCT UA - Microalbumin  4. Penicillin allergy Comments: She has anaphylactic reaction to PCN.   Patient was given opportunity to ask questions. Patient verbalized understanding of the plan and was able to repeat key elements of the plan. All questions were answered to their satisfaction.   I, Gwynneth Aliment, MD, have reviewed all documentation for this visit. The documentation on 01/15/21 for the exam, diagnosis, procedures, and orders are all accurate and complete.  THE PATIENT IS ENCOURAGED TO PRACTICE SOCIAL DISTANCING DUE TO THE COVID-19 PANDEMIC.

## 2021-01-22 ENCOUNTER — Telehealth: Payer: Medicare Other

## 2021-01-22 ENCOUNTER — Ambulatory Visit (INDEPENDENT_AMBULATORY_CARE_PROVIDER_SITE_OTHER): Payer: Medicare Other

## 2021-01-22 DIAGNOSIS — E1122 Type 2 diabetes mellitus with diabetic chronic kidney disease: Secondary | ICD-10-CM | POA: Diagnosis not present

## 2021-01-22 DIAGNOSIS — I129 Hypertensive chronic kidney disease with stage 1 through stage 4 chronic kidney disease, or unspecified chronic kidney disease: Secondary | ICD-10-CM | POA: Diagnosis not present

## 2021-01-22 DIAGNOSIS — E7800 Pure hypercholesterolemia, unspecified: Secondary | ICD-10-CM | POA: Diagnosis not present

## 2021-01-22 DIAGNOSIS — N182 Chronic kidney disease, stage 2 (mild): Secondary | ICD-10-CM | POA: Diagnosis not present

## 2021-01-22 NOTE — Chronic Care Management (AMB) (Signed)
Chronic Care Management   CCM RN Visit Note  01/22/2021 Name: Jean [name] MRN: [id number] DOB: [birth year]  Subjective: Jean [name] is a 80 y.o. year old female who is a primary care patient of Glendale Chard, MD. The care management team was consulted for assistance with disease management and care coordination needs.    Engaged with patient by telephone for follow up visit in response to provider referral for case management and/or care coordination services.   Consent to Services:  The patient was given information about Chronic Care Management services, agreed to services, and gave verbal consent prior to initiation of services.  Please see initial visit note for detailed documentation.   Patient agreed to services and verbal consent obtained.   Assessment: Review of patient past medical history, allergies, medications, health status, including review of consultants reports, laboratory and other test data, was performed as part of comprehensive evaluation and provision of chronic care management services.   SDOH (Social Determinants of Health) assessments and interventions performed:    CCM Care Plan  Allergies  Allergen Reactions   Penicillins Anaphylaxis    Outpatient Encounter Medications as of 01/22/2021  Medication Sig   ACCU-CHEK AVIVA PLUS test strip CHECK BLOOD SUGAR TWICE  DAILY AS DIRECTED   amLODipine (NORVASC) 10 MG tablet TAKE 1 TABLET BY MOUTH  DAILY   aspirin EC 81 MG tablet Take 81 mg by mouth daily.   benazepril (LOTENSIN) 20 MG tablet TAKE 1 TABLET BY MOUTH  DAILY   cholecalciferol (VITAMIN D) 1000 UNITS tablet Take 1,000 Units by mouth daily.   cinacalcet (SENSIPAR) 30 MG tablet Take 30 mg by mouth daily.   rosuvastatin (CRESTOR) 20 MG tablet TAKE 1 TABLET BY MOUTH  DAILY   TRADJENTA 5 MG TABS tablet TAKE ONE TABLET BY MOUTH ONCE DAILY   No facility-administered encounter medications on file as of 01/22/2021.    Patient Active Problem List    Diagnosis Date Noted   Type 2 diabetes mellitus with stage 2 chronic kidney disease, without long-term current use of insulin ([place]) 08/24/2018   Hypertensive nephropathy 08/24/2018   Gastroesophageal reflux disease without esophagitis 08/24/2018   Pure hypercholesterolemia 08/24/2018    Conditions to be addressed/monitored: DM II, Hypertensive Nephropathy, Pure hypercholesterolemia   Care Plan : Diabetes Type 2 (Adult)  Updates made by Lynne Logan, RN since 01/22/2021 12:00 AM     Problem: Glycemic Management (Diabetes, Type 2)   Priority: High     Long-Range Goal: Glycemic Management Optimized   Start Date: 09/29/2020  Expected End Date: 10/01/2021  Recent Progress: On track  Priority: High  Note:   Objective:  Lab Results  Component Value Date   HGBA1C 6.6 (H) 12/21/2020   Lab Results  Component Value Date   CREATININE 1.00 12/21/2020   CREATININE 0.99 08/23/2020   CREATININE 1.11 (H) 12/16/2019   Lab Results  Component Value Date   EGFR 57 (L) 12/21/2020  No results found for: EGFR Current Barriers:  Knowledge Deficits related to basic Diabetes pathophysiology and self care/management Knowledge Deficits related to medications used for management of diabetes Case Manager Clinical Goal(s):  patient will demonstrate improved adherence to prescribed treatment plan for diabetes self care/management as evidenced by: daily monitoring and recording of CBG  adherence to ADA/ carb modified diet exercise 5 days/week adherence to prescribed medication regimen contacting provider for new or worsened symptoms or questions Interventions:  01/22/21 successful outbound call completed with patient  Collaboration with Glendale Chard,  MD regarding development and update of comprehensive plan of care as evidenced by provider attestation and co-signature Inter-disciplinary care team collaboration (see longitudinal plan of care) Provided education to patient about basic DM disease  process Review of patient status, including review of consultants reports, relevant laboratory and other test results, and medications completed. Reviewed medications with patient and discussed importance of medication adherence Reviewed scheduled/upcoming provider appointments including: PCP follow up visit scheduled for 04/12/21 @11 :15 AM  Advised patient, providing education and rationale, to check cbg before meals and at bedtime and record, calling the CCM team and PCP for findings outside established parameters.   Discussed plans with patient for ongoing care management follow up and provided patient with direct contact information for care management team Self-Care Activities Self administers oral medications as prescribed Attends all scheduled provider appointments Checks blood sugars as prescribed and utilize hyper and hypoglycemia protocol as needed Adheres to prescribed ADA/carb modified Patient Goals: - lower A1c below 6.0 Follow Up Plan: Telephone follow up appointment with care management team member scheduled for: 04/17/21    Care Plan : Pure hypercholesterolemia  Updates made by Lynne Logan, RN since 01/22/2021 12:00 AM     Problem: Pure hypercholesterolemia   Priority: High     Long-Range Goal: Pure hypercholesterolemia - treatment optimized   Start Date: 01/22/2021  Expected End Date: 01/22/2022  This Visit's Progress: On track  Priority: High  Note:   Current Barriers:  Ineffective Self Health Maintenance  Clinical Goal(s):  Collaboration with Glendale Chard, MD regarding development and update of comprehensive plan of care as evidenced by provider attestation and co-signature Inter-disciplinary care team collaboration (see longitudinal plan of care) patient will work with care management team to address care coordination and chronic disease management needs related to Disease Management Educational Needs Care Coordination Medication Management and  Education Psychosocial Support   Interventions:  01/22/21 completed successful outbound call with patient  Evaluation of current treatment plan related to HLD, DMII, and Hypertensive Neuropathy , self-management and patient's adherence to plan as established by provider. Collaboration with Glendale Chard, MD regarding development and update of comprehensive plan of care as evidenced by provider attestation       and co-signature Inter-disciplinary care team collaboration (see longitudinal plan of care) Provided education to patient about basic disease process related to hypercholesterolemia  Review of patient status, including review of consultant's reports, relevant laboratory and other test results, and medications completed. Reviewed medications with patient and discussed importance of medication adherence Educated on dietary and exercise recommendations, benefits of increasing fiber intake  Mailed printed educational materials related to My Cholesterol Care Guides, Ways to increase fiber intake  Discussed plans with patient for ongoing care management follow up and provided patient with direct contact information for care management team Self Care Activities:  Patient verbalizes understanding of plan to increase fiber intake  Self administers medications as prescribed Attends all scheduled provider appointments Calls pharmacy for medication refills Calls provider office for new concerns or questions Patient Goals: - increase fiber intake  Follow Up Plan: Telephone follow up appointment with care management team member scheduled for: 04/17/21      Plan:Telephone follow up appointment with care management team member scheduled for:  04/17/21  Barb Merino, RN, BSN, CCM Care Management Coordinator [facility]  Direct Phone: [phone]

## 2021-01-22 NOTE — Patient Instructions (Signed)
Goals Addressed      Glycemic Management Optimized   On track    Timeframe:  Long-Range Goal Priority:  High Start Date:  09/29/20                           Expected End Date:  10/01/21  Follow Up Date: 04/17/21  Self administers oral medications as prescribed Attends all scheduled provider appointments Checks blood sugars as prescribed and utilize hyper and hypoglycemia protocol as needed Adheres to prescribed ADA/carb modified                         Pure hypercholesterolemia - treatment optimized   On track    Timeframe:  Long-Range Goal Priority:  High Start Date: 01/22/21                            Expected End Date:  01/22/22  Next Scheduled follow up: 04/17/21       Self Care Activities:  Patient verbalizes understanding of plan to increase fiber intake  Self administers medications as prescribed Attends all scheduled provider appointments Calls pharmacy for medication refills Calls provider office for new concerns or questions Patient Goals: - increase fiber intake

## 2021-01-23 ENCOUNTER — Other Ambulatory Visit: Payer: Self-pay | Admitting: Internal Medicine

## 2021-02-24 ENCOUNTER — Other Ambulatory Visit: Payer: Self-pay | Admitting: Internal Medicine

## 2021-04-12 ENCOUNTER — Encounter: Payer: Self-pay | Admitting: Internal Medicine

## 2021-04-12 ENCOUNTER — Ambulatory Visit (INDEPENDENT_AMBULATORY_CARE_PROVIDER_SITE_OTHER): Payer: Medicare Other | Admitting: Internal Medicine

## 2021-04-12 ENCOUNTER — Other Ambulatory Visit: Payer: Self-pay

## 2021-04-12 VITALS — BP 132/70 | HR 92 | Temp 98.6°F | Ht 65.4 in | Wt 150.4 lb

## 2021-04-12 DIAGNOSIS — N182 Chronic kidney disease, stage 2 (mild): Secondary | ICD-10-CM

## 2021-04-12 DIAGNOSIS — E1122 Type 2 diabetes mellitus with diabetic chronic kidney disease: Secondary | ICD-10-CM | POA: Diagnosis not present

## 2021-04-12 DIAGNOSIS — Z23 Encounter for immunization: Secondary | ICD-10-CM | POA: Diagnosis not present

## 2021-04-12 DIAGNOSIS — Z79899 Other long term (current) drug therapy: Secondary | ICD-10-CM | POA: Diagnosis not present

## 2021-04-12 DIAGNOSIS — R5383 Other fatigue: Secondary | ICD-10-CM | POA: Diagnosis not present

## 2021-04-12 DIAGNOSIS — Z6824 Body mass index (BMI) 24.0-24.9, adult: Secondary | ICD-10-CM | POA: Diagnosis not present

## 2021-04-12 DIAGNOSIS — I129 Hypertensive chronic kidney disease with stage 1 through stage 4 chronic kidney disease, or unspecified chronic kidney disease: Secondary | ICD-10-CM | POA: Diagnosis not present

## 2021-04-12 NOTE — Progress Notes (Signed)
I,YAMILKA J Llittleton,acting as a Education administrator for [name], MD.,have documented all relevant documentation on the behalf of [name], MD,as directed by  [name], MD while in the presence of [name], MD.  This visit occurred during the SARS-CoV-2 public health emergency.  Safety protocols were in place, including screening questions prior to the visit, additional usage of staff PPE, and extensive cleaning of exam room while observing appropriate contact time as indicated for disinfecting solutions.  Subjective:     Patient ID: Jean [name] , female    DOB: 05-10-1941 , 80 y.o.   MRN: [id number]   Chief Complaint  Patient presents with   Diabetes   Hypertension    HPI  She is here today for a diabetes and blood pressure f/u.  She reports compliance with meds. She denies headaches, chest pain and shortness of breath.   Diabetes She presents for her follow-up diabetic visit. She has type 2 diabetes mellitus. Her disease course has been worsening. Associated symptoms include fatigue. Pertinent negatives for diabetes include no blurred vision. There are no hypoglycemic complications. Diabetic complications include nephropathy. Risk factors for coronary artery disease include diabetes mellitus, dyslipidemia, hypertension and post-menopausal. Current diabetic treatment includes oral agent (monotherapy). She is compliant with treatment most of the time. She is following a diabetic diet. She participates in exercise intermittently. Her home blood glucose trend is decreasing steadily. An ACE inhibitor/angiotensin II receptor blocker is being taken. Eye exam is not current.  Hypertension This is a chronic problem. The current episode started more than 1 year ago. The problem has been gradually improving since onset. The problem is controlled. Pertinent negatives include no blurred vision. Risk factors for coronary artery disease include diabetes mellitus and dyslipidemia. Past  treatments include ACE inhibitors. The current treatment provides moderate improvement. Hypertensive end-organ damage includes kidney disease.    Past Medical History:  Diagnosis Date   Blood transfusion without reported diagnosis    No reaction    Diabetes mellitus without complication (HCC)    Hypertension      Family History  Problem Relation Age of Onset   Breast cancer Sister    Breast cancer Sister    Healthy Mother    Healthy Father      Current Outpatient Medications:    ACCU-CHEK AVIVA PLUS test strip, CHECK BLOOD SUGAR TWICE  DAILY AS DIRECTED, Disp: 200 strip, Rfl: 3   amLODipine (NORVASC) 10 MG tablet, TAKE 1 TABLET BY MOUTH  DAILY, Disp: 90 tablet, Rfl: 3   aspirin EC 81 MG tablet, Take 81 mg by mouth daily., Disp: , Rfl:    benazepril (LOTENSIN) 20 MG tablet, TAKE 1 TABLET BY MOUTH  DAILY, Disp: 90 tablet, Rfl: 3   cholecalciferol (VITAMIN D) 1000 UNITS tablet, Take 1,000 Units by mouth daily., Disp: , Rfl:    cinacalcet (SENSIPAR) 30 MG tablet, Take 30 mg by mouth daily., Disp: , Rfl:    rosuvastatin (CRESTOR) 20 MG tablet, TAKE 1 TABLET BY MOUTH  DAILY, Disp: 90 tablet, Rfl: 3   TRADJENTA 5 MG TABS tablet, TAKE ONE TABLET BY MOUTH ONCE DAILY, Disp: 90 tablet, Rfl: 5   Allergies  Allergen Reactions   Penicillins Anaphylaxis     Review of Systems  Constitutional:  Positive for fatigue. Negative for appetite change.  Eyes:  Negative for blurred vision.  Respiratory: Negative.    Cardiovascular: Negative.   Neurological: Negative.   Psychiatric/Behavioral: Negative.  Today's Vitals   04/12/21 1105  BP: 132/70  Pulse: 92  Temp: 98.6 F (37 C)  Weight: 150 lb 6.4 oz (68.2 kg)  Height: 5' 5.4" (1.661 m)  PainSc: 0-No pain   Body mass index is 24.72 kg/m.  Wt Readings from Last 3 Encounters:  04/12/21 150 lb 6.4 oz (68.2 kg)  01/15/21 151 lb 6.4 oz (68.7 kg)  12/21/20 150 lb 9.6 oz (68.3 kg)     Objective:  Physical Exam Vitals and nursing  note reviewed.  Constitutional:      Appearance: Normal appearance.  HENT:     Head: Normocephalic and atraumatic.     Nose:     Comments: Masked     Mouth/Throat:     Comments: Masked  Eyes:     Extraocular Movements: Extraocular movements intact.  Cardiovascular:     Rate and Rhythm: Normal rate and regular rhythm.     Heart sounds: Normal heart sounds.  Pulmonary:     Effort: Pulmonary effort is normal.     Breath sounds: Normal breath sounds.  Musculoskeletal:     Cervical back: Normal range of motion.  Skin:    General: Skin is warm.  Neurological:     General: No focal deficit present.     Mental Status: She is alert.  Psychiatric:        Mood and Affect: Mood normal.        Behavior: Behavior normal.        Assessment And Plan:     1. Type 2 diabetes mellitus with stage 2 chronic kidney disease, without long-term current use of insulin (HCC) Comments: Chronic, I will check labs as listed below. She was commended on good control of her blood sugars.  - CMP14+EGFR - Hemoglobin A1c  2. Hypertensive nephropathy Comments: Chronic, fair control. Goal BP is less than 130/80. Advised to follow low sodium diet.  3. Fatigue, unspecified type Comments: I will check vitamin B12 levels today. I will supplement as needed. Encouraged to stay well hydrated as well.  - TSH  4. Immunization due Comments: She was given high dose flu vaccine.  - Flu Vaccine QUAD High Dose(Fluad)  5. BMI 24.0-24.9, adult Comments: She is encouraged to aim for at least 150 minutes of exercise per week.   6. Drug therapy - Vitamin B12   Patient was given opportunity to ask questions. Patient verbalized understanding of the plan and was able to repeat key elements of the plan. All questions were answered to their satisfaction.   I, [name], MD, have reviewed all documentation for this visit. The documentation on 04/12/21 for the exam, diagnosis, procedures, and orders are all accurate  and complete.   IF YOU HAVE BEEN REFERRED TO A SPECIALIST, IT MAY TAKE 1-2 WEEKS TO SCHEDULE/PROCESS THE REFERRAL. IF YOU HAVE NOT HEARD FROM US/SPECIALIST IN TWO WEEKS, PLEASE GIVE [place] A CALL AT [phone].   THE PATIENT IS ENCOURAGED TO PRACTICE SOCIAL DISTANCING DUE TO THE COVID-19 PANDEMIC.

## 2021-04-12 NOTE — Patient Instructions (Signed)
Diabetes Mellitus and Nutrition, Adult When you have diabetes, or diabetes mellitus, it is very important to have healthy eating habits because your blood sugar (glucose) levels are greatly affected by what you eat and drink. Eating healthy foods in the right amounts, at about the same times every day, can help you:  Control your blood glucose.  Lower your risk of heart disease.  Improve your blood pressure.  Reach or maintain a healthy weight. What can affect my meal plan? Every person with diabetes is different, and each person has different needs for a meal plan. Your health care provider may recommend that you work with a dietitian to make a meal plan that is best for you. Your meal plan may vary depending on factors such as:  The calories you need.  The medicines you take.  Your weight.  Your blood glucose, blood pressure, and cholesterol levels.  Your activity level.  Other health conditions you have, such as heart or kidney disease. How do carbohydrates affect me? Carbohydrates, also called carbs, affect your blood glucose level more than any other type of food. Eating carbs naturally raises the amount of glucose in your blood. Carb counting is a method for keeping track of how many carbs you eat. Counting carbs is important to keep your blood glucose at a healthy level, especially if you use insulin or take certain oral diabetes medicines. It is important to know how many carbs you can safely have in each meal. This is different for every person. Your dietitian can help you calculate how many carbs you should have at each meal and for each snack. How does alcohol affect me? Alcohol can cause a sudden decrease in blood glucose (hypoglycemia), especially if you use insulin or take certain oral diabetes medicines. Hypoglycemia can be a life-threatening condition. Symptoms of hypoglycemia, such as sleepiness, dizziness, and confusion, are similar to symptoms of having too much  alcohol.  Do not drink alcohol if: ? Your health care provider tells you not to drink. ? You are pregnant, may be pregnant, or are planning to become pregnant.  If you drink alcohol: ? Do not drink on an empty stomach. ? Limit how much you use to:  0-1 drink a day for women.  0-2 drinks a day for men. ? Be aware of how much alcohol is in your drink. In the [place] one drink equals one 12 oz bottle of beer (355 mL), one 5 oz glass of wine (148 mL), or one 1 oz glass of hard liquor (44 mL). ? Keep yourself hydrated with water, diet soda, or unsweetened iced tea.  Keep in mind that regular soda, juice, and other mixers may contain a lot of sugar and must be counted as carbs. What are tips for following this plan? Reading food labels  Start by checking the serving size on the "Nutrition Facts" label of packaged foods and drinks. The amount of calories, carbs, fats, and other nutrients listed on the label is based on one serving of the item. Many items contain more than one serving per package.  Check the total grams (g) of carbs in one serving. You can calculate the number of servings of carbs in one serving by dividing the total carbs by 15. For example, if a food has 30 g of total carbs per serving, it would be equal to 2 servings of carbs.  Check the number of grams (g) of saturated fats and trans fats in one serving. Choose foods that have   a low amount or none of these fats.  Check the number of milligrams (mg) of salt (sodium) in one serving. Most people should limit total sodium intake to less than 2,300 mg per day.  Always check the nutrition information of foods labeled as "low-fat" or "nonfat." These foods may be higher in added sugar or refined carbs and should be avoided.  Talk to your dietitian to identify your daily goals for nutrients listed on the label. Shopping  Avoid buying canned, pre-made, or processed foods. These foods tend to be high in fat, sodium, and added  sugar.  Shop around the outside edge of the grocery store. This is where you will most often find fresh fruits and vegetables, bulk grains, fresh meats, and fresh dairy. Cooking  Use low-heat cooking methods, such as baking, instead of high-heat cooking methods like deep frying.  Cook using healthy oils, such as olive, canola, or sunflower oil.  Avoid cooking with butter, cream, or high-fat meats. Meal planning  Eat meals and snacks regularly, preferably at the same times every day. Avoid going long periods of time without eating.  Eat foods that are high in fiber, such as fresh fruits, vegetables, beans, and whole grains. Talk with your dietitian about how many servings of carbs you can eat at each meal.  Eat 4-6 oz (112-168 g) of lean protein each day, such as lean meat, chicken, fish, eggs, or tofu. One ounce (oz) of lean protein is equal to: ? 1 oz (28 g) of meat, chicken, or fish. ? 1 egg. ?  cup (62 g) of tofu.  Eat some foods each day that contain healthy fats, such as avocado, nuts, seeds, and fish.   What foods should I eat? Fruits Berries. Apples. Oranges. Peaches. Apricots. Plums. Grapes. Mango. Papaya. Pomegranate. Kiwi. Cherries. Vegetables Lettuce. Spinach. Leafy greens, including kale, chard, collard greens, and mustard greens. Beets. Cauliflower. Cabbage. Broccoli. Carrots. Green beans. Tomatoes. Peppers. Onions. Cucumbers. Brussels sprouts. Grains Whole grains, such as whole-wheat or whole-grain bread, crackers, tortillas, cereal, and pasta. Unsweetened oatmeal. Quinoa. Brown or wild rice. Meats and other proteins Seafood. Poultry without skin. Lean cuts of poultry and beef. Tofu. Nuts. Seeds. Dairy Low-fat or fat-free dairy products such as milk, yogurt, and cheese. The items listed above may not be a complete list of foods and beverages you can eat. Contact a dietitian for more information. What foods should I avoid? Fruits Fruits canned with  syrup. Vegetables Canned vegetables. Frozen vegetables with butter or cream sauce. Grains Refined white flour and flour products such as bread, pasta, snack foods, and cereals. Avoid all processed foods. Meats and other proteins Fatty cuts of meat. Poultry with skin. Breaded or fried meats. Processed meat. Avoid saturated fats. Dairy Full-fat yogurt, cheese, or milk. Beverages Sweetened drinks, such as soda or iced tea. The items listed above may not be a complete list of foods and beverages you should avoid. Contact a dietitian for more information. Questions to ask a health care provider  Do I need to meet with a diabetes educator?  Do I need to meet with a dietitian?  What number can I call if I have questions?  When are the best times to check my blood glucose? Where to find more information:  American Diabetes Association: diabetes.org  Academy of Nutrition and Dietetics: www.eatright.org  National Institute of Diabetes and Digestive and Kidney Diseases: www.niddk.nih.gov  Association of Diabetes Care and Education Specialists: www.diabeteseducator.org Summary  It is important to have healthy eating   habits because your blood sugar (glucose) levels are greatly affected by what you eat and drink.  A healthy meal plan will help you control your blood glucose and maintain a healthy lifestyle.  Your health care provider may recommend that you work with a dietitian to make a meal plan that is best for you.  Keep in mind that carbohydrates (carbs) and alcohol have immediate effects on your blood glucose levels. It is important to count carbs and to use alcohol carefully. This information is not intended to replace advice given to you by your health care provider. Make sure you discuss any questions you have with your health care provider. Document Revised: 06/22/2019 Document Reviewed: 06/22/2019 Elsevier Patient Education  2021 Elsevier Inc.  

## 2021-04-13 LAB — CMP14+EGFR
ALT: 17 IU/L (ref 0–32)
AST: 20 IU/L (ref 0–40)
Albumin/Globulin Ratio: 1.4 (ref 1.2–2.2)
Albumin: 5 g/dL — ABNORMAL HIGH (ref 3.6–4.6)
Alkaline Phosphatase: 144 IU/L — ABNORMAL HIGH (ref 44–121)
BUN/Creatinine Ratio: 12 (ref 12–28)
BUN: 12 mg/dL (ref 8–27)
Bilirubin Total: 0.5 mg/dL (ref 0.0–1.2)
CO2: 22 mmol/L (ref 20–29)
Calcium: 10.3 mg/dL (ref 8.7–10.3)
Chloride: 100 mmol/L (ref 96–106)
Creatinine, Ser: 1.01 mg/dL — ABNORMAL HIGH (ref 0.57–1.00)
Globulin, Total: 3.5 g/dL (ref 1.5–4.5)
Glucose: 71 mg/dL (ref 65–99)
Potassium: 4.1 mmol/L (ref 3.5–5.2)
Sodium: 138 mmol/L (ref 134–144)
Total Protein: 8.5 g/dL (ref 6.0–8.5)
eGFR: 56 mL/min/{1.73_m2} — ABNORMAL LOW (ref >59)

## 2021-04-13 LAB — TSH: TSH: 1.49 u[IU]/mL (ref 0.450–4.500)

## 2021-04-13 LAB — HEMOGLOBIN A1C
Est. average glucose Bld gHb Est-mCnc: 140 mg/dL
Hgb A1c MFr Bld: 6.5 % — ABNORMAL HIGH (ref 4.8–5.6)

## 2021-04-13 LAB — VITAMIN B12: Vitamin B-12: 910 pg/mL (ref 232–1245)

## 2021-04-17 ENCOUNTER — Ambulatory Visit (INDEPENDENT_AMBULATORY_CARE_PROVIDER_SITE_OTHER): Payer: Medicare Other

## 2021-04-17 ENCOUNTER — Telehealth: Payer: Medicare Other

## 2021-04-17 DIAGNOSIS — E7800 Pure hypercholesterolemia, unspecified: Secondary | ICD-10-CM

## 2021-04-17 DIAGNOSIS — E1122 Type 2 diabetes mellitus with diabetic chronic kidney disease: Secondary | ICD-10-CM

## 2021-04-17 DIAGNOSIS — I129 Hypertensive chronic kidney disease with stage 1 through stage 4 chronic kidney disease, or unspecified chronic kidney disease: Secondary | ICD-10-CM

## 2021-04-23 NOTE — Patient Instructions (Addendum)
Visit Information  PATIENT GOALS:  Goals Addressed      Glycemic Management Optimized   On track    Timeframe:  Long-Range Goal Priority:  High Start Date:  09/29/20                           Expected End Date:  09/29/21  Follow Up Date: 08/16/21  Self administers oral medications as prescribed Attends all scheduled provider appointments Checks blood sugars as prescribed and utilize hyper and hypoglycemia protocol as needed Adheres to prescribed ADA/carb modified                        Pure hypercholesterolemia - treatment optimized   On track    Timeframe:  Long-Range Goal Priority:  High Start Date: 01/22/21                            Expected End Date:  01/22/22  Follow up date: 08/16/21       Patient Goals: - increase fiber intake     - adhere to dietary and exercise recommendations               Track and Manage Symptoms related to Chronic Kidney disease   On track    Timeframe:  Long-Range Goal Priority:  High Start Date:  04/17/21                           Expected End Date:  04/17/22      Follow up date: 08/16/21         Patient Goals: - increase water to 64 oz daily - track and manage symptoms related to chronic kidney - keep DM and BP under good control per parameters set by PCP as discussed                  The patient verbalized understanding of instructions, educational materials, and care plan provided today and declined offer to receive copy of patient instructions, educational materials, and care plan.   Telephone follow up appointment with care management team member scheduled for: 08/16/20  [name], RN, BSN, CCM Care Management Coordinator [facility]  Direct Phone: [phone]

## 2021-04-23 NOTE — Chronic Care Management (AMB) (Signed)
Chronic Care Management   CCM RN Visit Note  04/17/2021 Name: Jean [name] MRN: [id number] DOB: [birth year]  Subjective: Jean [name] is a 80 y.o. year old female who is a primary care patient of Glendale Chard, MD. The care management team was consulted for assistance with disease management and care coordination needs.    Engaged with patient by telephone for follow up visit in response to provider referral for case management and/or care coordination services.   Consent to Services:  The patient was given information about Chronic Care Management services, agreed to services, and gave verbal consent prior to initiation of services.  Please see initial visit note for detailed documentation.   Patient agreed to services and verbal consent obtained.   Assessment: Review of patient past medical history, allergies, medications, health status, including review of consultants reports, laboratory and other test data, was performed as part of comprehensive evaluation and provision of chronic care management services.   SDOH (Social Determinants of Health) assessments and interventions performed:    CCM Care Plan  Allergies  Allergen Reactions   Penicillins Anaphylaxis    Outpatient Encounter Medications as of 04/17/2021  Medication Sig   ACCU-CHEK AVIVA PLUS test strip CHECK BLOOD SUGAR TWICE  DAILY AS DIRECTED   amLODipine (NORVASC) 10 MG tablet TAKE 1 TABLET BY MOUTH  DAILY   aspirin EC 81 MG tablet Take 81 mg by mouth daily.   benazepril (LOTENSIN) 20 MG tablet TAKE 1 TABLET BY MOUTH  DAILY   cholecalciferol (VITAMIN D) 1000 UNITS tablet Take 1,000 Units by mouth daily.   cinacalcet (SENSIPAR) 30 MG tablet Take 30 mg by mouth daily.   rosuvastatin (CRESTOR) 20 MG tablet TAKE 1 TABLET BY MOUTH  DAILY   TRADJENTA 5 MG TABS tablet TAKE ONE TABLET BY MOUTH ONCE DAILY   No facility-administered encounter medications on file as of 04/17/2021.    Patient Active Problem List    Diagnosis Date Noted   Type 2 diabetes mellitus with stage 2 chronic kidney disease, without long-term current use of insulin ([place]) 08/24/2018   Hypertensive nephropathy 08/24/2018   Gastroesophageal reflux disease without esophagitis 08/24/2018   Pure hypercholesterolemia 08/24/2018    Conditions to be addressed/monitored: DM II, Hypertensive Nephropathy, Pure hypercholesterolemia   Care Plan : Diabetes Type 2 (Adult)  Updates made by Lynne Logan, RN since 04/17/2021 12:00 AM     Problem: Glycemic Management (Diabetes, Type 2)   Priority: High     Long-Range Goal: Glycemic Management Optimized   Start Date: 09/29/2020  Expected End Date: 09/28/2021  Recent Progress: On track  Priority: High  Note:   Objective:  Lab Results  Component Value Date   HGBA1C 6.5 (H) 04/12/2021   Lab Results  Component Value Date   CREATININE 1.01 (H) 04/12/2021   CREATININE 1.00 12/21/2020   CREATININE 0.99 08/23/2020   Lab Results  Component Value Date   EGFR 56 (L) 04/12/2021   Current Barriers:  Knowledge Deficits related to basic Diabetes pathophysiology and self care/management Knowledge Deficits related to medications used for management of diabetes Case Manager Clinical Goal(s):  patient will demonstrate improved adherence to prescribed treatment plan for diabetes self care/management as evidenced by: daily monitoring and recording of CBG  adherence to ADA/ carb modified diet exercise 5 days/week adherence to prescribed medication regimen contacting provider for new or worsened symptoms or questions Interventions:  04/17/21 successful outbound call completed with patient  Collaboration with Glendale Chard, MD regarding development and  update of comprehensive plan of care as evidenced by provider attestation and co-signature Inter-disciplinary care team collaboration (see longitudinal plan of care) Provided education to patient about basic DM disease process Review of patient status,  including review of consultants reports, relevant laboratory and other test results, and medications completed. Reviewed medications with patient and discussed importance of medication adherence Reviewed scheduled/upcoming provider appointments including: PCP follow up visit scheduled for 06/14/21 $[id number]'@[id number]'[id number]$ :40 AM   Advised patient, providing education and rationale, to check cbg before meals and at bedtime and record, calling the CCM team and PCP for findings outside established parameters.   Mailed printed educational materials related to Low Carb Smoothies; Mediterranean diet; Carb Choice List; Meal Planning  Discussed plans with patient for ongoing care management follow up and provided patient with direct contact information for care management team Self-Care Activities Self administers oral medications as prescribed Attends all scheduled provider appointments Checks blood sugars as prescribed and utilize hyper and hypoglycemia protocol as needed Adheres to prescribed ADA/carb modified Patient Goals: - continue to adhere to dietary and exercise recommendations   Follow Up Plan: Telephone follow up appointment with care management team member scheduled for: 08/16/21    Care Plan : Pure hypercholesterolemia  Updates made by Lynne Logan, RN since 04/17/2021 12:00 AM     Problem: Pure hypercholesterolemia   Priority: High     Long-Range Goal: Pure hypercholesterolemia - treatment optimized   Start Date: 01/22/2021  Expected End Date: 01/22/2022  Recent Progress: On track  Priority: High  Note:   Current Barriers:  Ineffective Self Health Maintenance  Clinical Goal(s):  Collaboration with Glendale Chard, MD regarding development and update of comprehensive plan of care as evidenced by provider attestation and co-signature Inter-disciplinary care team collaboration (see longitudinal plan of care) patient will work with care management team to address care coordination and chronic disease  management needs related to Disease Management Educational Needs Care Coordination Medication Management and Education Psychosocial Support   Interventions:  04/17/21 completed successful outbound call with patient  Evaluation of current treatment plan related to HLD, DMII, and Hypertensive Neuropathy , self-management and patient's adherence to plan as established by provider. Collaboration with Glendale Chard, MD regarding development and update of comprehensive plan of care as evidenced by provider attestation       and co-signature Inter-disciplinary care team collaboration (see longitudinal plan of care) Provided education to patient about basic disease process related to hypercholesterolemia  Review of patient status, including review of consultant's reports, relevant laboratory and other test results, and medications completed. Reviewed medications with patient and discussed importance of medication adherence Educated on dietary and exercise recommendations, benefits of increasing fiber intake  Mailed printed educational materials related to My Cholesterol Care Guides, Ways to increase fiber intake  Discussed plans with patient for ongoing care management follow up and provided patient with direct contact information for care management team Self Care Activities:  Patient verbalizes understanding of plan to increase fiber intake  Self administers medications as prescribed Attends all scheduled provider appointments Calls pharmacy for medication refills Calls provider office for new concerns or questions Patient Goals: - increase fiber intake  -adhere to dietary and exercise recommendations  Follow Up Plan: Telephone follow up appointment with care management team member scheduled for: 08/16/21     Care Plan : Chronic Kidney (Adult)  Updates made by Lynne Logan, RN since 04/17/2021 12:00 AM     Problem: Stage 3 Chronic Kidney disease   Priority: High  Long-Range Goal:  Track and Manage symptoms related to Chronic Kidney disease   Start Date: 04/17/2021  Expected End Date: 04/17/2022  This Visit's Progress: On track  Priority: High  Note:   Current Barriers:  Ineffective Self Health Maintenance in a patient with DM II, Hypertensive Nephropathy, Pure hypercholesterolemia  Clinical Goal(s):  Collaboration with Glendale Chard, MD regarding development and update of comprehensive plan of care as evidenced by provider attestation and co-signature Inter-disciplinary care team collaboration (see longitudinal plan of care) patient will work with care management team to address care coordination and chronic disease management needs related to Disease Management Educational Needs Care Coordination Medication Management and Education Medication Reconciliation Psychosocial Support   Interventions:  04/17/21 completed successful outbound call with patient  Evaluation of current treatment plan related to CKD Stage III , self-management and patient's adherence to plan as established by provider. Collaboration with Glendale Chard, MD regarding development and update of comprehensive plan of care as evidenced by provider attestation       and co-signature Inter-disciplinary care team collaboration (see longitudinal plan of care) Provided education to patient about basic disease process related to stage 3 Chronic Kidney disease including symptoms that may occur  Review of patient status, including review of consultant's reports, relevant laboratory and other test results, and medications completed. Reviewed medications with patient and discussed importance of medication adherence Educated on the importance of keeping DM and BP under good control per parameters set by PCP; Educated on the importance of increasing water intake to 64 oz daily  Mailed printed educational materials related to Cooking Right with Kidney disease; Stages of Chronic Kidney disease  Discussed plans  with patient for ongoing care management follow up and provided patient with direct contact information for care management team Self Care Activities:  Self administers medications as prescribed Attends all scheduled provider appointments Calls pharmacy for medication refills Calls provider office for new concerns or questions Patient Goals: -increase water to 64 oz daily - track and manage symptoms related to chronic kidney - keep DM and BP under good control per parameters set by PCP as discussed   Follow Up Plan: Telephone follow up appointment with care management team member scheduled for: 08/16/20      Plan:Telephone follow up appointment with care management team member scheduled for:  08/16/21  Barb Merino, RN, BSN, CCM Care Management Coordinator [place] [facility]  Direct Phone: [phone]

## 2021-04-27 DIAGNOSIS — E1122 Type 2 diabetes mellitus with diabetic chronic kidney disease: Secondary | ICD-10-CM

## 2021-04-27 DIAGNOSIS — E7800 Pure hypercholesterolemia, unspecified: Secondary | ICD-10-CM | POA: Diagnosis not present

## 2021-04-27 DIAGNOSIS — I129 Hypertensive chronic kidney disease with stage 1 through stage 4 chronic kidney disease, or unspecified chronic kidney disease: Secondary | ICD-10-CM | POA: Diagnosis not present

## 2021-04-27 DIAGNOSIS — N182 Chronic kidney disease, stage 2 (mild): Secondary | ICD-10-CM | POA: Diagnosis not present

## 2021-05-17 ENCOUNTER — Telehealth: Payer: Self-pay | Admitting: Pharmacist

## 2021-05-17 NOTE — Progress Notes (Signed)
[facility])  [place] Team    05/17/2021  Jean [name] 1940-10-23 [id number]  Reason for referral:  2023 Patient medication assistance re-enrollment   Referral source:  2023 re-enrollment  Current insurance: [facility]  Outreach:  Successful telephone call with Jean [name].  HIPAA identifiers verified.    Lab Results  Component Value Date   CREATININE 1.01 (H) 04/12/2021   CREATININE 1.00 12/21/2020   CREATININE 0.99 08/23/2020    Lab Results  Component Value Date   HGBA1C 6.5 (H) 04/12/2021    Lipid Panel     Component Value Date/Time   CHOL 198 12/21/2020 1214   TRIG 171 (H) 12/21/2020 1214   HDL 63 12/21/2020 1214   CHOLHDL 3.1 12/21/2020 1214   CHOLHDL 5.3 10/23/2007 0218   VLDL 35 10/23/2007 0218   LDLCALC 106 (H) 12/21/2020 1214    BP Readings from Last 3 Encounters:  04/12/21 132/70  01/15/21 134/72  12/21/20 130/76    Allergies  Allergen Reactions   Penicillins Anaphylaxis    Medication Assistance Findings:  Medication assistance needs identified: Tradjenta re-enrollment   Extra Help:  Not eligible for Extra Help Low Income Subsidy based on reported income and assets  Patient Assistance Programs: Tradjenta made by [employer] requirement met: Yes Out-of-pocket prescription expenditure met:   Not Applicable Patient has met application requirements to apply for this program.    Additional medication assistance options reviewed with patient as warranted:  Insurance OTC catalogue Jean [name] confirms that she utilizes her OTC benefit quarterly.   Plan: I will route patient assistance letter to [place] technician who will coordinate patient assistance program application process for medications listed above.  [facility] pharmacy technician will assist with obtaining all required documents from both patient and provider(s) and submit application(s) once completed.

## 2021-05-17 NOTE — Progress Notes (Signed)
[facility])  [facility] Quality Pharmacy Team    05/17/2021  Jean [name] [birth year] [id number]  Reason for referral:  2023 re-enrollment for patient medication assistance   Referral source:  2023 re-enrollment  Current insurance: [facility]  Outreach:  Unsuccessful. Left HIPPA compliant message for patient to return my call.

## 2021-06-06 ENCOUNTER — Telehealth: Payer: Self-pay | Admitting: Pharmacy Technician

## 2021-06-06 DIAGNOSIS — Z596 Low income: Secondary | ICD-10-CM

## 2021-06-06 NOTE — Progress Notes (Signed)
[facility])                                            [facility] Quality Pharmacy Team    06/06/2021  Jean [name] 22-Nov-1940 [id number]  FOR 2023 RE ENROLLMENT                                      Medication Assistance Referral  Referral From: [facility] RPh Tiffany B.  Medication/Company: [name] / [employer] Patient application portion:  Mailed Provider application portion: Faxed  to Dr. [name] Provider address/fax verified via: Office website     [name] P. Ulys Favia, CPhT [facility] [employer]  [phone]

## 2021-06-25 DIAGNOSIS — N182 Chronic kidney disease, stage 2 (mild): Secondary | ICD-10-CM | POA: Diagnosis not present

## 2021-06-25 DIAGNOSIS — E213 Hyperparathyroidism, unspecified: Secondary | ICD-10-CM | POA: Diagnosis not present

## 2021-06-29 ENCOUNTER — Other Ambulatory Visit: Payer: Medicare Other

## 2021-07-06 DIAGNOSIS — I129 Hypertensive chronic kidney disease with stage 1 through stage 4 chronic kidney disease, or unspecified chronic kidney disease: Secondary | ICD-10-CM | POA: Diagnosis not present

## 2021-07-06 DIAGNOSIS — N1831 Chronic kidney disease, stage 3a: Secondary | ICD-10-CM | POA: Diagnosis not present

## 2021-07-06 DIAGNOSIS — D631 Anemia in chronic kidney disease: Secondary | ICD-10-CM | POA: Diagnosis not present

## 2021-07-06 DIAGNOSIS — E213 Hyperparathyroidism, unspecified: Secondary | ICD-10-CM | POA: Diagnosis not present

## 2021-08-07 ENCOUNTER — Telehealth: Payer: Self-pay | Admitting: Pharmacy Technician

## 2021-08-07 DIAGNOSIS — Z596 Low income: Secondary | ICD-10-CM

## 2021-08-07 NOTE — Progress Notes (Signed)
[facility])                                            [facility] Quality Pharmacy Team    08/07/2021  Jean [name] 1940/11/23 [id number]  Received both patient and provider portion(s) of patient assistance application(s) for Tradjenta. Faxed completed application and required documents into [employer].   Amory Zbikowski P. Oiva Dibari, CPhT [facility] [employer]  [phone]

## 2021-08-14 ENCOUNTER — Ambulatory Visit (INDEPENDENT_AMBULATORY_CARE_PROVIDER_SITE_OTHER): Payer: Medicare Other | Admitting: Internal Medicine

## 2021-08-14 ENCOUNTER — Other Ambulatory Visit: Payer: Self-pay

## 2021-08-14 ENCOUNTER — Encounter: Payer: Self-pay | Admitting: Internal Medicine

## 2021-08-14 VITALS — BP 118/66 | HR 98 | Temp 98.1°F | Ht 65.4 in | Wt 150.2 lb

## 2021-08-14 DIAGNOSIS — Z23 Encounter for immunization: Secondary | ICD-10-CM | POA: Diagnosis not present

## 2021-08-14 DIAGNOSIS — Z6824 Body mass index (BMI) 24.0-24.9, adult: Secondary | ICD-10-CM

## 2021-08-14 DIAGNOSIS — E1122 Type 2 diabetes mellitus with diabetic chronic kidney disease: Secondary | ICD-10-CM | POA: Diagnosis not present

## 2021-08-14 DIAGNOSIS — I129 Hypertensive chronic kidney disease with stage 1 through stage 4 chronic kidney disease, or unspecified chronic kidney disease: Secondary | ICD-10-CM | POA: Diagnosis not present

## 2021-08-14 DIAGNOSIS — N182 Chronic kidney disease, stage 2 (mild): Secondary | ICD-10-CM

## 2021-08-14 DIAGNOSIS — R202 Paresthesia of skin: Secondary | ICD-10-CM | POA: Diagnosis not present

## 2021-08-14 LAB — POCT URINALYSIS DIPSTICK
Bilirubin, UA: NEGATIVE
Blood, UA: NEGATIVE
Glucose, UA: NEGATIVE
Ketones, UA: NEGATIVE
Leukocytes, UA: NEGATIVE
Nitrite, UA: NEGATIVE
Protein, UA: NEGATIVE
Spec Grav, UA: 1.015 (ref 1.010–1.025)
Urobilinogen, UA: 0.2 E.U./dL
pH, UA: 5.5 (ref 5.0–8.0)

## 2021-08-14 NOTE — Patient Instructions (Addendum)
Your physician has ordered a Nerve Conduction Study (NCV) and/or EMG testing.  This is a test to assess the status of your nerves and muscles. For the NCV portion of the test , sticky tabs will be placed on either your hands or feet.  Your nerves will be stimulated using small electrical charges and the speed of the impulse will be measured as it travels down the nerve. For the EMG portion of the test, a small pin will be placed below the surface of the skin to measure the electrical activity in certain muscles in your arms or legs. Eating/drinking prior to testing is ok.  Please DO NOT discontinue ANY medications prior to testing  Your appointment will be scheduled by a member of our team.  You will need to check in with the main reception desk. Your test could take approximately 30 minutes to 1 hour to complete depending on the extent of the test that has been ordered. TESTING ON LEGS/BACK/HIP/FOOT AREA: Bring/wear a pair of shorts.  **ABSOLUTELY NO LOTIONS, MOISTURIZERS, VASELINE, OILS OR CREAMS OF ANY KIND ON ANY BODY PART THE DAY OF THE TEST**  **DEODORANT IS OK TO WEAR**  Please make every effort to keep your scheduled appointment time, as your physician needs the test results prior to your next appointment.  If you have to cancel or reschedule, please do so as soon as possible, so that another patient may use that testing time slot.  If you cancel your appointment, you may also need to reschedule your referring doctor's appointment until the test and results can be completed.  Please call [phone] and ask for Dr. Dover Blas assistant if you need to do so.  If you have any questions at any time please let us know.  We look forward to working with you!!  Diabetes Mellitus and Nutrition, Adult When you have diabetes, or diabetes mellitus, it is very important to have healthy eating habits because your blood sugar (glucose) levels are greatly affected by what you eat and drink. Eating  healthy foods in the right amounts, at about the same times every day, can help you: Manage your blood glucose. Lower your risk of heart disease. Improve your blood pressure. Reach or maintain a healthy weight. What can affect my meal plan? Every person with diabetes is different, and each person has different needs for a meal plan. Your health care provider may recommend that you work with a dietitian to make a meal plan that is best for you. Your meal plan may vary depending on factors such as: The calories you need. The medicines you take. Your weight. Your blood glucose, blood pressure, and cholesterol levels. Your activity level. Other health conditions you have, such as heart or kidney disease. How do carbohydrates affect me? Carbohydrates, also called carbs, affect your blood glucose level more than any other type of food. Eating carbs raises the amount of glucose in your blood. It is important to know how many carbs you can safely have in each meal. This is different for every person. Your dietitian can help you calculate how many carbs you should have at each meal and for each snack. How does alcohol affect me? Alcohol can cause a decrease in blood glucose (hypoglycemia), especially if you use insulin or take certain diabetes medicines by mouth. Hypoglycemia can be a life-threatening condition. Symptoms of hypoglycemia, such as sleepiness, dizziness, and confusion, are similar to symptoms of having too much alcohol. Do not drink alcohol if: Your health care  provider tells you not to drink. You are pregnant, may be pregnant, or are planning to become pregnant. If you drink alcohol: Limit how much you have to: 0-1 drink a day for women. 0-2 drinks a day for men. Know how much alcohol is in your drink. In the [place] one drink equals one 12 oz bottle of beer (355 mL), one 5 oz glass of wine (148 mL), or one 1 oz glass of hard liquor (44 mL). Keep yourself hydrated with water, diet  soda, or unsweetened iced tea. Keep in mind that regular soda, juice, and other mixers may contain a lot of sugar and must be counted as carbs. What are tips for following this plan? Reading food labels Start by checking the serving size on the Nutrition Facts label of packaged foods and drinks. The number of calories and the amount of carbs, fats, and other nutrients listed on the label are based on one serving of the item. Many items contain more than one serving per package. Check the total grams (g) of carbs in one serving. Check the number of grams of saturated fats and trans fats in one serving. Choose foods that have a low amount or none of these fats. Check the number of milligrams (mg) of salt (sodium) in one serving. Most people should limit total sodium intake to less than 2,300 mg per day. Always check the nutrition information of foods labeled as "low-fat" or "nonfat." These foods may be higher in added sugar or refined carbs and should be avoided. Talk to your dietitian to identify your daily goals for nutrients listed on the label. Shopping Avoid buying canned, pre-made, or processed foods. These foods tend to be high in fat, sodium, and added sugar. Shop around the outside edge of the grocery store. This is where you will most often find fresh fruits and vegetables, bulk grains, fresh meats, and fresh dairy products. Cooking Use low-heat cooking methods, such as baking, instead of high-heat cooking methods, such as deep frying. Cook using healthy oils, such as olive, canola, or sunflower oil. Avoid cooking with butter, cream, or high-fat meats. Meal planning Eat meals and snacks regularly, preferably at the same times every day. Avoid going long periods of time without eating. Eat foods that are high in fiber, such as fresh fruits, vegetables, beans, and whole grains. Eat 4-6 oz (112-168 g) of lean protein each day, such as lean meat, chicken, fish, eggs, or tofu. One ounce (oz)  (28 g) of lean protein is equal to: 1 oz (28 g) of meat, chicken, or fish. 1 egg.  cup (62 g) of tofu. Eat some foods each day that contain healthy fats, such as avocado, nuts, seeds, and fish. What foods should I eat? Fruits Berries. Apples. Oranges. Peaches. Apricots. Plums. Grapes. Mangoes. Papayas. Pomegranates. Kiwi. Cherries. Vegetables Leafy greens, including lettuce, spinach, kale, chard, collard greens, mustard greens, and cabbage. Beets. Cauliflower. Broccoli. Carrots. Green beans. Tomatoes. Peppers. Onions. Cucumbers. Brussels sprouts. Grains Whole grains, such as whole-wheat or whole-grain bread, crackers, tortillas, cereal, and pasta. Unsweetened oatmeal. Quinoa. Brown or wild rice. Meats and other proteins Seafood. Poultry without skin. Lean cuts of poultry and beef. Tofu. Nuts. Seeds. Dairy Low-fat or fat-free dairy products such as milk, yogurt, and cheese. The items listed above may not be a complete list of foods and beverages you can eat and drink. Contact a dietitian for more information. What foods should I avoid? Fruits Fruits canned with syrup. Vegetables Canned vegetables. Frozen vegetables with  butter or cream sauce. Grains Refined white flour and flour products such as bread, pasta, snack foods, and cereals. Avoid all processed foods. Meats and other proteins Fatty cuts of meat. Poultry with skin. Breaded or fried meats. Processed meat. Avoid saturated fats. Dairy Full-fat yogurt, cheese, or milk. Beverages Sweetened drinks, such as soda or iced tea. The items listed above may not be a complete list of foods and beverages you should avoid. Contact a dietitian for more information. Questions to ask a health care provider Do I need to meet with a certified diabetes care and education specialist? Do I need to meet with a dietitian? What number can I call if I have questions? When are the best times to check my blood glucose? Where to find more  information: American Diabetes Association: diabetes.org Academy of Nutrition and Dietetics: eatright.[employer] National Institute of Diabetes and Digestive and Kidney Diseases: StageSync.siniddk.nih.gov Association of Diabetes Care & Education Specialists: diabeteseducator.org Summary It is important to have healthy eating habits because your blood sugar (glucose) levels are greatly affected by what you eat and drink. It is important to use alcohol carefully. A healthy meal plan will help you manage your blood glucose and lower your risk of heart disease. Your health care provider may recommend that you work with a dietitian to make a meal plan that is best for you. This information is not intended to replace advice given to you by your health care provider. Make sure you discuss any questions you have with your health care provider. Document Revised: 02/16/2020 Document Reviewed: 02/16/2020 Elsevier Patient Education  2022 [employer]

## 2021-08-14 NOTE — Progress Notes (Signed)
[name],acting as a Education administrator for [name], MD.,have documented all relevant documentation on the behalf of [name], MD,as directed by  [name], MD while in the presence of [name], MD.  This visit occurred during the SARS-CoV-2 public health emergency.  Safety protocols were in place, including screening questions prior to the visit, additional usage of staff PPE, and extensive cleaning of exam room while observing appropriate contact time as indicated for disinfecting solutions.  Subjective:     Patient ID: Jean [name] , female    DOB: 1941-07-21 , 81 y.o.   MRN: [id number]   Chief Complaint  Patient presents with   Diabetes   Hypertension    HPI  She is here today for a diabetes and blood pressure f/u.  She reports compliance with meds. She denies headaches, chest pain and shortness of breath. She states she feels well.   Diabetes She presents for her follow-up diabetic visit. She has type 2 diabetes mellitus. Her disease course has been worsening. Pertinent negatives for diabetes include no blurred vision, no polydipsia, no polyphagia and no polyuria. There are no hypoglycemic complications. Diabetic complications include nephropathy. Risk factors for coronary artery disease include diabetes mellitus, dyslipidemia, hypertension and post-menopausal. Current diabetic treatment includes oral agent (monotherapy). She is compliant with treatment most of the time. She is following a diabetic diet. She participates in exercise intermittently. Her home blood glucose trend is decreasing steadily. An ACE inhibitor/angiotensin II receptor blocker is being taken. Eye exam is not current.  Hypertension This is a chronic problem. The current episode started more than 1 year ago. The problem has been gradually improving since onset. The problem is controlled. Pertinent negatives include no blurred vision. Risk factors for coronary artery disease include diabetes  mellitus and dyslipidemia. Past treatments include ACE inhibitors. The current treatment provides moderate improvement. Hypertensive end-organ damage includes kidney disease.    Past Medical History:  Diagnosis Date   Blood transfusion without reported diagnosis    No reaction    Diabetes mellitus without complication (HCC)    Hypertension      Family History  Problem Relation Age of Onset   Breast cancer Sister    Breast cancer Sister    Healthy Mother    Healthy Father      Current Outpatient Medications:    ACCU-CHEK AVIVA PLUS test strip, CHECK BLOOD SUGAR TWICE  DAILY AS DIRECTED, Disp: 200 strip, Rfl: 3   amLODipine (NORVASC) 10 MG tablet, TAKE 1 TABLET BY MOUTH  DAILY, Disp: 90 tablet, Rfl: 3   aspirin EC 81 MG tablet, Take 81 mg by mouth daily., Disp: , Rfl:    benazepril (LOTENSIN) 20 MG tablet, TAKE 1 TABLET BY MOUTH  DAILY, Disp: 90 tablet, Rfl: 3   cholecalciferol (VITAMIN D) 1000 UNITS tablet, Take 1,000 Units by mouth daily., Disp: , Rfl:    cinacalcet (SENSIPAR) 30 MG tablet, Take 30 mg by mouth daily., Disp: , Rfl:    rosuvastatin (CRESTOR) 20 MG tablet, TAKE 1 TABLET BY MOUTH  DAILY, Disp: 90 tablet, Rfl: 3   TRADJENTA 5 MG TABS tablet, TAKE ONE TABLET BY MOUTH ONCE DAILY, Disp: 90 tablet, Rfl: 5   Allergies  Allergen Reactions   Penicillins Anaphylaxis     Review of Systems  Constitutional: Negative.   Eyes: Negative.  Negative for blurred vision.  Respiratory: Negative.    Cardiovascular: Negative.   Endocrine: Negative for polydipsia, polyphagia and polyuria.  Musculoskeletal: Negative.   Skin: Negative.   Neurological:  Positive for numbness (right hand).       She c/o r hand numbness/tingling. States entire hand gets numb. Also has weakness in her r hand. She is right-handed. Denies known h/o CTS.   Psychiatric/Behavioral: Negative.      Today's Vitals   08/14/21 1029  BP: 118/66  Pulse: 98  Temp: 98.1 F (36.7 C)  Weight: 150 lb 3.2 oz (68.1  kg)  Height: 5' 5.4" (1.661 m)  PainSc: 7   PainLoc: Wrist   Body mass index is 24.69 kg/m.  Wt Readings from Last 3 Encounters:  08/14/21 150 lb 3.2 oz (68.1 kg)  04/12/21 150 lb 6.4 oz (68.2 kg)  01/15/21 151 lb 6.4 oz (68.7 kg)     Objective:  Physical Exam Vitals and nursing note reviewed.  Constitutional:      Appearance: Normal appearance.  HENT:     Head: Normocephalic and atraumatic.     Nose:     Comments: Masked     Mouth/Throat:     Comments: Masked  Eyes:     Extraocular Movements: Extraocular movements intact.  Cardiovascular:     Rate and Rhythm: Normal rate and regular rhythm.     Heart sounds: Normal heart sounds.  Pulmonary:     Effort: Pulmonary effort is normal.     Breath sounds: Normal breath sounds.  Musculoskeletal:     Cervical back: Normal range of motion.     Comments: Pos Phalen's sign, Pos Tinel's sign  Skin:    General: Skin is warm.  Neurological:     General: No focal deficit present.     Mental Status: She is alert.  Psychiatric:        Mood and Affect: Mood normal.        Behavior: Behavior normal.        Assessment And Plan:     1. Type 2 diabetes mellitus with stage 2 chronic kidney disease, without long-term current use of insulin (HCC) Comments: Chronic, I will check labs as below. She will rto in April 2023 as previously scheduled for her next diabetes check.  - POCT Urinalysis Dipstick (81002) - Microalbumin / Creatinine Urine Ratio - CMP14+EGFR - Hemoglobin A1c  2. Hypertensive nephropathy Comments: Chronic, well controlled. No med chnages. She is encouraged to follow sodium diet.   3. Right hand paresthesia Comments: Sx are suggestive of carpal tunnel. She agrees to NCV, I will refer her to Headache/Wellness. Does not wish to see Neuro first as required by [facility].  - Vitamin B12 - Ambulatory referral to Neurology  4. BMI 24.0-24.9, adult Comments: She is at a healthy BMI.  She is encouraged to aim for at least 150  minutes of exercise per week.   5. Immunization due - Pneumococcal conjugate vaccine 20-valent (Prevnar-20)   Patient was given opportunity to ask questions. Patient verbalized understanding of the plan and was able to repeat key elements of the plan. All questions were answered to their satisfaction.   I, [name], MD, have reviewed all documentation for this visit. The documentation on 08/14/21 for the exam, diagnosis, procedures, and orders are all accurate and complete.   IF YOU HAVE BEEN REFERRED TO A SPECIALIST, IT MAY TAKE 1-2 WEEKS TO SCHEDULE/PROCESS THE REFERRAL. IF YOU HAVE NOT HEARD FROM US/SPECIALIST IN TWO WEEKS, PLEASE GIVE [place] A CALL AT [phone].   THE PATIENT IS ENCOURAGED TO PRACTICE SOCIAL DISTANCING DUE  TO THE COVID-19 PANDEMIC.

## 2021-08-15 LAB — CMP14+EGFR
ALT: 17 IU/L (ref 0–32)
AST: 22 IU/L (ref 0–40)
Albumin/Globulin Ratio: 1.6 (ref 1.2–2.2)
Albumin: 5 g/dL — ABNORMAL HIGH (ref 3.6–4.6)
Alkaline Phosphatase: 129 IU/L — ABNORMAL HIGH (ref 44–121)
BUN/Creatinine Ratio: 12 (ref 12–28)
BUN: 13 mg/dL (ref 8–27)
Bilirubin Total: 0.5 mg/dL (ref 0.0–1.2)
CO2: 21 mmol/L (ref 20–29)
Calcium: 10.2 mg/dL (ref 8.7–10.3)
Chloride: 103 mmol/L (ref 96–106)
Creatinine, Ser: 1.11 mg/dL — ABNORMAL HIGH (ref 0.57–1.00)
Globulin, Total: 3.2 g/dL (ref 1.5–4.5)
Glucose: 81 mg/dL (ref 70–99)
Potassium: 4.6 mmol/L (ref 3.5–5.2)
Sodium: 139 mmol/L (ref 134–144)
Total Protein: 8.2 g/dL (ref 6.0–8.5)
eGFR: 50 mL/min/{1.73_m2} — ABNORMAL LOW (ref >59)

## 2021-08-15 LAB — MICROALBUMIN / CREATININE URINE RATIO
Creatinine, Urine: 78 mg/dL
Microalb/Creat Ratio: 42 mg/g creat — ABNORMAL HIGH (ref 0–29)
Microalbumin, Urine: 33 ug/mL

## 2021-08-15 LAB — VITAMIN B12: Vitamin B-12: 1075 pg/mL (ref 232–1245)

## 2021-08-15 LAB — HEMOGLOBIN A1C
Est. average glucose Bld gHb Est-mCnc: 148 mg/dL
Hgb A1c MFr Bld: 6.8 % — ABNORMAL HIGH (ref 4.8–5.6)

## 2021-08-16 ENCOUNTER — Ambulatory Visit (INDEPENDENT_AMBULATORY_CARE_PROVIDER_SITE_OTHER): Payer: Medicare Other

## 2021-08-16 ENCOUNTER — Telehealth: Payer: Medicare Other

## 2021-08-16 DIAGNOSIS — N182 Chronic kidney disease, stage 2 (mild): Secondary | ICD-10-CM

## 2021-08-16 DIAGNOSIS — I129 Hypertensive chronic kidney disease with stage 1 through stage 4 chronic kidney disease, or unspecified chronic kidney disease: Secondary | ICD-10-CM

## 2021-08-16 DIAGNOSIS — E1122 Type 2 diabetes mellitus with diabetic chronic kidney disease: Secondary | ICD-10-CM

## 2021-08-16 DIAGNOSIS — E7800 Pure hypercholesterolemia, unspecified: Secondary | ICD-10-CM

## 2021-08-16 NOTE — Chronic Care Management (AMB) (Signed)
Chronic Care Management   CCM RN Visit Note  08/16/2021 Name: Jean [name] MRN: [id number] DOB: [birth year]  Subjective: Jean [name] is a 81 y.o. year old female who is a primary care patient of Glendale Chard, MD. The care management team was consulted for assistance with disease management and care coordination needs.    Engaged with patient by telephone for follow up visit in response to provider referral for case management and/or care coordination services.   Consent to Services:  The patient was given information about Chronic Care Management services, agreed to services, and gave verbal consent prior to initiation of services.  Please see initial visit note for detailed documentation.   Patient agreed to services and verbal consent obtained.   Assessment: Review of patient past medical history, allergies, medications, health status, including review of consultants reports, laboratory and other test data, was performed as part of comprehensive evaluation and provision of chronic care management services.   SDOH (Social Determinants of Health) assessments and interventions performed:  Yes, no acute needs   CCM Care Plan  Allergies  Allergen Reactions   Penicillins Anaphylaxis    Outpatient Encounter Medications as of 08/16/2021  Medication Sig   ACCU-CHEK AVIVA PLUS test strip CHECK BLOOD SUGAR TWICE  DAILY AS DIRECTED   amLODipine (NORVASC) 10 MG tablet TAKE 1 TABLET BY MOUTH  DAILY   aspirin EC 81 MG tablet Take 81 mg by mouth daily.   benazepril (LOTENSIN) 20 MG tablet TAKE 1 TABLET BY MOUTH  DAILY   cholecalciferol (VITAMIN D) 1000 UNITS tablet Take 1,000 Units by mouth daily.   cinacalcet (SENSIPAR) 30 MG tablet Take 30 mg by mouth daily.   rosuvastatin (CRESTOR) 20 MG tablet TAKE 1 TABLET BY MOUTH  DAILY   TRADJENTA 5 MG TABS tablet TAKE ONE TABLET BY MOUTH ONCE DAILY   No facility-administered encounter medications on file as of 08/16/2021.    Patient Active  Problem List   Diagnosis Date Noted   Type 2 diabetes mellitus with stage 2 chronic kidney disease, without long-term current use of insulin ([place]) 08/24/2018   Hypertensive nephropathy 08/24/2018   Gastroesophageal reflux disease without esophagitis 08/24/2018   Pure hypercholesterolemia 08/24/2018    Conditions to be addressed/monitored: DM II, Hypertensive Nephropathy, Pure hypercholesterolemia   Care Plan : RN Care Manager Plan of Care  Updates made by Lynne Logan, RN since 08/16/2021 12:00 AM     Problem: No Plan of Care established for management of chronic disease states (DM II, Hypertensive Nephropathy, Pure hypercholesterolemia)   Priority: High     Long-Range Goal: Establishment of Plan of Care for management of chronic disease states (DM II, Hypertensive Nephropathy, Pure hypercholesterolemia)   Start Date: 08/16/2021  Expected End Date: 08/16/2022  This Visit's Progress: On track  Priority: High  Note:   Current Barriers:  Knowledge Deficits related to plan of care for management of DM II, Hypertensive Nephropathy, Pure hypercholesterolemia   Chronic Disease Management support and education needs related to DM II, Hypertensive Nephropathy, Pure hypercholesterolemia    RNCM Clinical Goal(s):  Patient will verbalize basic understanding of  DM II, Hypertensive Nephropathy, Pure hypercholesterolemia  disease process and self health management plan as evidenced by patient will report having no disease exacerbations related to her chronic disease states as listed above  take all medications exactly as prescribed and will call provider for medication related questions as evidenced by patient will report having no missed doses of her prescribed medications demonstrate  Ongoing health management independence as evidenced by patient will report 100% adherence to her prescribed treatment plan  continue to work with RN Care Manager to address care management and care coordination needs  related to  DM II, Hypertensive Nephropathy, Pure hypercholesterolemia  as evidenced by adherence to CM Team Scheduled appointments demonstrate ongoing self health care management ability   as evidenced by    through collaboration with RN Care manager, provider, and care team.   Interventions: 1:1 collaboration with primary care provider regarding development and update of comprehensive plan of care as evidenced by provider attestation and co-signature Inter-disciplinary care team collaboration (see longitudinal plan of care) Evaluation of current treatment plan related to  self management and patient's adherence to plan as established by provider    Chronic Kidney Disease Interventions:  (Status:  Goal on track:  Yes.) Long Term Goal Assessed the Patient understanding of chronic kidney disease    Evaluation of current treatment plan related to chronic kidney disease self management and patient's adherence to plan as established by provider      Reviewed prescribed diet continue to drink 64 oz of water daily  Provided education on kidney disease progression    Engage patient in early, proactive and ongoing discussion about goals of care and what matters most to them    Discussed plans with patient for ongoing care management follow up and provided patient with direct contact information for care management team Last practice recorded BP readings:  BP Readings from Last 3 Encounters:  08/14/21 118/66  04/12/21 132/70  01/15/21 134/72  Most recent eGFR/CrCl:  Lab Results  Component Value Date   EGFR 50 (L) 08/14/2021    No components found for: CRCL  Diabetes Interventions:  (Status:  Goal on track:  Yes.) Long Term Goal Assessed patient's understanding of A1c goal:  <5.7 Reviewed medications with patient and discussed importance of medication adherence Review of patient status, including review of consultants reports, relevant laboratory and other test results, and medications  completed Reinforced importance of adhering to dietary and exercise recommendations  Discussed plans with patient for ongoing care management follow up and provided patient with direct contact information for care management team Lab Results  Component Value Date   HGBA1C 6.8 (H) 08/14/2021   Hyperlipidemia Interventions:  (Status:  Goal on track:  Yes.) Long Term Goal Medication review performed; medication list updated in electronic medical record.  Provider established cholesterol goals reviewed Counseled on importance of regular laboratory monitoring as prescribed Reviewed importance of limiting foods high in cholesterol Reviewed exercise goals and target of 150 minutes per week Discussed plans with patient for ongoing care management follow up and provided patient with direct contact information for care management team  Patient Goals/Self-Care Activities: Take all medications as prescribed Attend all scheduled provider appointments Call pharmacy for medication refills 3-7 days in advance of running out of medications Perform IADL's (shopping, preparing meals, housekeeping, managing finances) independently Call provider office for new concerns or questions  drink 6 to 8 glasses of water each day manage portion size choose a place to take my blood pressure (home, clinic or office, retail store) take medications for blood pressure exactly as prescribed report new symptoms to your doctor adhere to prescribed diet: low trans/saturated fat  Follow Up Plan:  Telephone follow up appointment with care management team member scheduled for:  11/22/21      Plan:Telephone follow up appointment with care management team member scheduled for:  11/22/21  Barb Merino,  RN, BSN, [place] Management Coordinator [facility]  Direct Phone: [phone]

## 2021-08-16 NOTE — Patient Instructions (Signed)
Visit Information  Thank you for taking time to visit with me today. Please don't hesitate to contact me if I can be of assistance to you before our next scheduled telephone appointment.  Following are the goals we discussed today:  (Copy and paste patient goals from clinical care plan here)  Our next appointment is by telephone on 11/22/21 at 10:30 AM  Please call the care guide team at [phone] if you need to cancel or reschedule your appointment.   If you are experiencing a Mental Health or [place] or need someone to talk to, please call 1-800-273-TALK (toll free, 24 hour hotline)   The patient verbalized understanding of instructions, educational materials, and care plan provided today and agreed to receive a mailed copy of patient instructions, educational materials, and care plan.   Barb Merino, RN, BSN, CCM Care Management Coordinator [facility]  Direct Phone: [phone]

## 2021-08-22 ENCOUNTER — Telehealth: Payer: Self-pay | Admitting: Pharmacy Technician

## 2021-08-22 DIAGNOSIS — Z596 Low income: Secondary | ICD-10-CM

## 2021-08-22 NOTE — Progress Notes (Signed)
[facility] Customer service manager [facility])                                            [facility] Team    08/22/2021  Jean [name] 08/23/1940 [id number]  Care coordination call placed to [facility] in regard to [employer] application.  Spoke to Florentina Addison who informs patient is APPROVED 08/14/21-07/28/22. She informs patient will need to call BI for refills giving BI approximately 2 weeks for processing and delivery to the patient's home.  Jameir Ake P. Daniya Aramburo, CPhT [facility] [employer]  [phone]

## 2021-08-23 DIAGNOSIS — G5603 Carpal tunnel syndrome, bilateral upper limbs: Secondary | ICD-10-CM | POA: Diagnosis not present

## 2021-08-27 ENCOUNTER — Telehealth: Payer: Self-pay

## 2021-08-27 NOTE — Telephone Encounter (Signed)
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

## 2021-08-28 DIAGNOSIS — E1122 Type 2 diabetes mellitus with diabetic chronic kidney disease: Secondary | ICD-10-CM

## 2021-08-28 DIAGNOSIS — E7800 Pure hypercholesterolemia, unspecified: Secondary | ICD-10-CM

## 2021-08-28 DIAGNOSIS — E785 Hyperlipidemia, unspecified: Secondary | ICD-10-CM

## 2021-08-28 DIAGNOSIS — N182 Chronic kidney disease, stage 2 (mild): Secondary | ICD-10-CM | POA: Diagnosis not present

## 2021-09-06 ENCOUNTER — Telehealth: Payer: Medicare Other

## 2021-09-06 ENCOUNTER — Ambulatory Visit (INDEPENDENT_AMBULATORY_CARE_PROVIDER_SITE_OTHER): Payer: Medicare Other

## 2021-09-06 DIAGNOSIS — I129 Hypertensive chronic kidney disease with stage 1 through stage 4 chronic kidney disease, or unspecified chronic kidney disease: Secondary | ICD-10-CM

## 2021-09-06 DIAGNOSIS — E7800 Pure hypercholesterolemia, unspecified: Secondary | ICD-10-CM

## 2021-09-06 DIAGNOSIS — N182 Chronic kidney disease, stage 2 (mild): Secondary | ICD-10-CM

## 2021-09-06 DIAGNOSIS — E1122 Type 2 diabetes mellitus with diabetic chronic kidney disease: Secondary | ICD-10-CM

## 2021-09-06 NOTE — Chronic Care Management (AMB) (Signed)
Chronic Care Management   CCM RN Visit Note  09/06/2021 Name: Jean [name] MRN: [id number] DOB: 06-Aug-1940  Subjective: Jean [name] is a 81 y.o. year old female who is a primary care patient of Glendale Chard, MD. The care management team was consulted for assistance with disease management and care coordination needs.    Engaged with patient by telephone for follow up visit in response to provider referral for case management and/or care coordination services.   Consent to Services:  The patient was given information about Chronic Care Management services, agreed to services, and gave verbal consent prior to initiation of services.  Please see initial visit note for detailed documentation.   Patient agreed to services and verbal consent obtained.   Assessment: Review of patient past medical history, allergies, medications, health status, including review of consultants reports, laboratory and other test data, was performed as part of comprehensive evaluation and provision of chronic care management services.   SDOH (Social Determinants of Health) assessments and interventions performed:    CCM Care Plan  Allergies  Allergen Reactions   Penicillins Anaphylaxis    Outpatient Encounter Medications as of 09/06/2021  Medication Sig   ACCU-CHEK AVIVA PLUS test strip CHECK BLOOD SUGAR TWICE  DAILY AS DIRECTED   amLODipine (NORVASC) 10 MG tablet TAKE 1 TABLET BY MOUTH  DAILY   aspirin EC 81 MG tablet Take 81 mg by mouth daily.   benazepril (LOTENSIN) 20 MG tablet TAKE 1 TABLET BY MOUTH  DAILY   cholecalciferol (VITAMIN D) 1000 UNITS tablet Take 1,000 Units by mouth daily.   cinacalcet (SENSIPAR) 30 MG tablet Take 30 mg by mouth daily.   rosuvastatin (CRESTOR) 20 MG tablet TAKE 1 TABLET BY MOUTH  DAILY   TRADJENTA 5 MG TABS tablet TAKE ONE TABLET BY MOUTH ONCE DAILY   No facility-administered encounter medications on file as of 09/06/2021.    Patient Active Problem List   Diagnosis  Date Noted   Type 2 diabetes mellitus with stage 2 chronic kidney disease, without long-term current use of insulin ([place]) 08/24/2018   Hypertensive nephropathy 08/24/2018   Gastroesophageal reflux disease without esophagitis 08/24/2018   Pure hypercholesterolemia 08/24/2018    Conditions to be addressed/monitored: DM II, Hypertensive Nephropathy, Pure hypercholesterolemia   Care Plan : RN Care Manager Plan of Care  Updates made by Lynne Logan, RN since 09/06/2021 12:00 AM     Problem: No Plan of Care established for management of chronic disease states (DM II, Hypertensive Nephropathy, Pure hypercholesterolemia)   Priority: High     Long-Range Goal: Establishment of Plan of Care for management of chronic disease states (DM II, Hypertensive Nephropathy, Pure hypercholesterolemia)   Start Date: 08/16/2021  Expected End Date: 08/16/2022  Recent Progress: On track  Priority: High  Note:   Current Barriers:  Knowledge Deficits related to plan of care for management of DM II, Hypertensive Nephropathy, Pure hypercholesterolemia   Chronic Disease Management support and education needs related to DM II, Hypertensive Nephropathy, Pure hypercholesterolemia    RNCM Clinical Goal(s):  Patient will verbalize basic understanding of  DM II, Hypertensive Nephropathy, Pure hypercholesterolemia  disease process and self health management plan as evidenced by patient will report having no disease exacerbations related to her chronic disease states as listed above  take all medications exactly as prescribed and will call provider for medication related questions as evidenced by patient will report having no missed doses of her prescribed medications demonstrate Ongoing health management independence as  evidenced by patient will report 100% adherence to her prescribed treatment plan  continue to work with RN Care Manager to address care management and care coordination needs related to  DM II, Hypertensive  Nephropathy, Pure hypercholesterolemia  as evidenced by adherence to CM Team Scheduled appointments demonstrate ongoing self health care management ability   as evidenced by    through collaboration with RN Care manager, provider, and care team.   Interventions: 1:1 collaboration with primary care provider regarding development and update of comprehensive plan of care as evidenced by provider attestation and co-signature Inter-disciplinary care team collaboration (see longitudinal plan of care) Evaluation of current treatment plan related to  self management and patient's adherence to plan as established by provider   Chronic Kidney Disease Interventions:  (Status:  Condition stable.  Not addressed this visit.) Long Term Goal Assessed the Patient understanding of chronic kidney disease    Evaluation of current treatment plan related to chronic kidney disease self management and patient's adherence to plan as established by provider      Reviewed prescribed diet continue to drink 64 oz of water daily  Provided education on kidney disease progression    Engage patient in early, proactive and ongoing discussion about goals of care and what matters most to them    Discussed plans with patient for ongoing care management follow up and provided patient with direct contact information for care management team Last practice recorded BP readings:  BP Readings from Last 3 Encounters:  08/14/21 118/66  04/12/21 132/70  01/15/21 134/72  Most recent eGFR/CrCl:  Lab Results  Component Value Date   EGFR 50 (L) 08/14/2021    No components found for: CRCL  Diabetes Interventions:  (Status:  Condition stable.  Not addressed this visit.) Long Term Goal Assessed patient's understanding of A1c goal:  <5.7 Reviewed medications with patient and discussed importance of medication adherence Review of patient status, including review of consultants reports, relevant laboratory and other test results, and  medications completed Reinforced importance of adhering to dietary and exercise recommendations  Discussed plans with patient for ongoing care management follow up and provided patient with direct contact information for care management team Lab Results  Component Value Date   HGBA1C 6.8 (H) 08/14/2021  Hyperlipidemia Interventions:  (Status:  Condition stable.  Not addressed this visit.) Long Term Goal Medication review performed; medication list updated in electronic medical record.  Provider established cholesterol goals reviewed Counseled on importance of regular laboratory monitoring as prescribed Reviewed importance of limiting foods high in cholesterol Reviewed exercise goals and target of 150 minutes per week Discussed plans with patient for ongoing care management follow up and provided patient with direct contact information for care management team   Right Hand Paresthesia: (Status:  New goal.)  Short Term Goal Evaluation of current treatment plan related to  Carpal Tunnel ,  self-management and patient's adherence to plan as established by provider Determined patient completed a visit with Dr. Domingo Cocking, Neurologist for evaluation of her right hand paresthesia Discussed patient was diagnosed with bilateral Carpal Tunnel Syndrome for which she will treat with conservative treatment at this time Educated patient on basic disease process related to Carpal Tunnel Syndrome, instructed to avoid triggers that may worsen condition and to wear the night splints as directed as needed  Determined patient denies having questions at this time, she verbalizes understanding of the diagnosis and treatment options  Discussed plans with patient for ongoing care management follow up and provided patient with  direct contact information for care management team   Patient Goals/Self-Care Activities: Take all medications as prescribed Attend all scheduled provider appointments Call pharmacy for  medication refills 3-7 days in advance of running out of medications Perform IADL's (shopping, preparing meals, housekeeping, managing finances) independently Call provider office for new concerns or questions  drink 6 to 8 glasses of water each day manage portion size choose a place to take my blood pressure (home, clinic or office, retail store) take medications for blood pressure exactly as prescribed report new symptoms to your doctor adhere to prescribed diet: low trans/saturated fat  Follow Up Plan:  Telephone follow up appointment with care management team member scheduled for:  11/22/21      Plan:Telephone follow up appointment with care management team member scheduled for:  11/22/21  Barb Merino, RN, BSN, CCM Care Management Coordinator [place] [facility]  Direct Phone: [phone]

## 2021-09-06 NOTE — Patient Instructions (Signed)
Visit Information ° °Thank you for taking time to visit with me today. Please don't hesitate to contact me if I can be of assistance to you before our next scheduled telephone appointment. ° °Following are the goals we discussed today:  °(Copy and paste patient goals from clinical care plan here) ° °Our next appointment is by telephone on 11/22/21 at 10:30 AM ° °Please call the care guide team at [phone] if you need to cancel or reschedule your appointment.  ° °If you are experiencing a Mental Health or Behavioral Health Crisis or need someone to talk to, please call 1-800-273-TALK (toll free, 24 hour hotline)  ° °The patient verbalized understanding of instructions, educational materials, and care plan provided today and agreed to receive a mailed copy of patient instructions, educational materials, and care plan.  ° °Mete Purdum, RN, BSN, CCM °Care Management Coordinator °[facility]  °Direct Phone: [phone] ° °

## 2021-09-25 DIAGNOSIS — N182 Chronic kidney disease, stage 2 (mild): Secondary | ICD-10-CM | POA: Diagnosis not present

## 2021-09-25 DIAGNOSIS — E7800 Pure hypercholesterolemia, unspecified: Secondary | ICD-10-CM

## 2021-09-25 DIAGNOSIS — E1122 Type 2 diabetes mellitus with diabetic chronic kidney disease: Secondary | ICD-10-CM

## 2021-09-25 DIAGNOSIS — I129 Hypertensive chronic kidney disease with stage 1 through stage 4 chronic kidney disease, or unspecified chronic kidney disease: Secondary | ICD-10-CM

## 2021-10-02 DIAGNOSIS — E119 Type 2 diabetes mellitus without complications: Secondary | ICD-10-CM | POA: Diagnosis not present

## 2021-10-02 LAB — HM DIABETES EYE EXAM

## 2021-10-03 ENCOUNTER — Encounter: Payer: Self-pay | Admitting: Internal Medicine

## 2021-11-12 ENCOUNTER — Ambulatory Visit
Admission: RE | Admit: 2021-11-12 | Discharge: 2021-11-12 | Disposition: A | Discharge status: Home / Self Care | Payer: Medicare Other | Source: Ambulatory Visit | Attending: Internal Medicine | Admitting: Internal Medicine

## 2021-11-12 DIAGNOSIS — Z780 Asymptomatic menopausal state: Secondary | ICD-10-CM | POA: Diagnosis not present

## 2021-11-12 DIAGNOSIS — M8589 Other specified disorders of bone density and structure, multiple sites: Secondary | ICD-10-CM | POA: Diagnosis not present

## 2021-11-12 DIAGNOSIS — E2839 Other primary ovarian failure: Secondary | ICD-10-CM

## 2021-11-15 ENCOUNTER — Other Ambulatory Visit: Payer: Self-pay | Admitting: Internal Medicine

## 2021-11-15 DIAGNOSIS — N182 Chronic kidney disease, stage 2 (mild): Secondary | ICD-10-CM

## 2021-11-20 ENCOUNTER — Ambulatory Visit (INDEPENDENT_AMBULATORY_CARE_PROVIDER_SITE_OTHER): Payer: Medicare Other | Admitting: Internal Medicine

## 2021-11-20 ENCOUNTER — Encounter: Payer: Self-pay | Admitting: Internal Medicine

## 2021-11-20 VITALS — BP 122/78 | HR 102 | Temp 98.0°F | Ht 65.4 in | Wt 151.0 lb

## 2021-11-20 DIAGNOSIS — E1122 Type 2 diabetes mellitus with diabetic chronic kidney disease: Secondary | ICD-10-CM | POA: Diagnosis not present

## 2021-11-20 DIAGNOSIS — N182 Chronic kidney disease, stage 2 (mild): Secondary | ICD-10-CM | POA: Diagnosis not present

## 2021-11-20 DIAGNOSIS — Z6824 Body mass index (BMI) 24.0-24.9, adult: Secondary | ICD-10-CM | POA: Diagnosis not present

## 2021-11-20 DIAGNOSIS — E7800 Pure hypercholesterolemia, unspecified: Secondary | ICD-10-CM

## 2021-11-20 DIAGNOSIS — I129 Hypertensive chronic kidney disease with stage 1 through stage 4 chronic kidney disease, or unspecified chronic kidney disease: Secondary | ICD-10-CM

## 2021-11-20 NOTE — Progress Notes (Signed)
?[name],acting as a Education administrator for [name], MD.,have documented all relevant documentation on the behalf of [name], MD,as directed by  [name], MD while in the presence of [name], MD.  ?This visit occurred during the SARS-CoV-2 public health emergency.  Safety protocols were in place, including screening questions prior to the visit, additional usage of staff PPE, and extensive cleaning of exam room while observing appropriate contact time as indicated for disinfecting solutions. ? ?Subjective:  ?  ? Patient ID: Jean [name] , female    DOB: October 04, 1940 , 81 y.o.   MRN: [id number] ? ? ?Chief Complaint  ?Patient presents with  ? Diabetes  ? Hypertension  ? ? ?HPI ? ?She is here today for a diabetes and blood pressure f/u.  She reports compliance with meds. She denies headaches, chest pain and shortness of breath.  ? ?Diabetes ?She presents for her follow-up diabetic visit. She has type 2 diabetes mellitus. Her disease course has been worsening. Pertinent negatives for diabetes include no blurred vision, no polydipsia, no polyphagia and no polyuria. There are no hypoglycemic complications. Diabetic complications include nephropathy. Risk factors for coronary artery disease include diabetes mellitus, dyslipidemia, hypertension and post-menopausal. Current diabetic treatment includes oral agent (monotherapy). She is compliant with treatment most of the time. She is following a diabetic diet. She participates in exercise intermittently. Her home blood glucose trend is decreasing steadily. An ACE inhibitor/angiotensin II receptor blocker is being taken. Eye exam is not current.  ?Hypertension ?This is a chronic problem. The current episode started more than 1 year ago. The problem has been gradually improving since onset. The problem is controlled. Pertinent negatives include no blurred vision. Risk factors for coronary artery disease include diabetes mellitus and dyslipidemia.  Past treatments include ACE inhibitors. The current treatment provides moderate improvement. Hypertensive end-organ damage includes kidney disease.   ? ?Past Medical History:  ?Diagnosis Date  ? Blood transfusion without reported diagnosis   ? No reaction   ? Diabetes mellitus without complication ([place])   ? Hypertension   ?  ? ?Family History  ?Problem Relation Age of Onset  ? Breast cancer Sister   ? Breast cancer Sister   ? Healthy Mother   ? Healthy Father   ? ? ? ?Current Outpatient Medications:  ?  ACCU-CHEK AVIVA PLUS test strip, CHECK BLOOD SUGAR TWICE  DAILY AS DIRECTED, Disp: 200 strip, Rfl: 3 ?  amLODipine (NORVASC) 10 MG tablet, TAKE 1 TABLET BY MOUTH  DAILY, Disp: 90 tablet, Rfl: 3 ?  aspirin EC 81 MG tablet, Take 81 mg by mouth daily., Disp: , Rfl:  ?  benazepril (LOTENSIN) 20 MG tablet, TAKE 1 TABLET BY MOUTH  DAILY, Disp: 90 tablet, Rfl: 3 ?  cholecalciferol (VITAMIN D) 1000 UNITS tablet, Take 1,000 Units by mouth daily., Disp: , Rfl:  ?  cinacalcet (SENSIPAR) 30 MG tablet, Take 30 mg by mouth daily., Disp: , Rfl:  ?  rosuvastatin (CRESTOR) 20 MG tablet, TAKE 1 TABLET BY MOUTH  DAILY, Disp: 90 tablet, Rfl: 3 ?  TRADJENTA 5 MG TABS tablet, TAKE ONE TABLET BY MOUTH ONCE DAILY, Disp: 90 tablet, Rfl: 5  ? ?Allergies  ?Allergen Reactions  ? Penicillins Anaphylaxis  ?  ? ?Review of Systems  ?Constitutional: Negative.   ?Eyes: Negative.  Negative for blurred vision.  ?Respiratory: Negative.    ?Cardiovascular: Negative.   ?Gastrointestinal: Negative.   ?Endocrine: Negative for polydipsia, polyphagia and polyuria.  ?Musculoskeletal:  Negative.   ?Skin: Negative.   ?Neurological: Negative.   ?Psychiatric/Behavioral: Negative.     ? ?Today's Vitals  ? 11/20/21 0910  ?BP: 122/78  ?Pulse: (!) 102  ?Temp: 98 ?F (36.7 ?C)  ?Weight: 151 lb (68.5 kg)  ?Height: 5' 5.4" (1.661 m)  ?PainSc: 0-No pain  ? ?Body mass index is 24.82 kg/m?.  ?Wt Readings from Last 3 Encounters:  ?11/20/21 151 lb (68.5 kg)  ?08/14/21 150 lb  3.2 oz (68.1 kg)  ?04/12/21 150 lb 6.4 oz (68.2 kg)  ?  ? ?Objective:  ?Physical Exam ?Vitals and nursing note reviewed.  ?Constitutional:   ?   Appearance: Normal appearance.  ?HENT:  ?   Head: Normocephalic and atraumatic.  ?Eyes:  ?   Extraocular Movements: Extraocular movements intact.  ?Cardiovascular:  ?   Rate and Rhythm: Normal rate and regular rhythm.  ?   Heart sounds: Normal heart sounds.  ?Pulmonary:  ?   Effort: Pulmonary effort is normal.  ?   Breath sounds: Normal breath sounds.  ?Musculoskeletal:  ?   Cervical back: Normal range of motion.  ?Skin: ?   General: Skin is warm.  ?Neurological:  ?   General: No focal deficit present.  ?   Mental Status: She is alert.  ?Psychiatric:     ?   Mood and Affect: Mood normal.     ?   Behavior: Behavior normal.  ?   ?Assessment And Plan:  ?   ?1. Type 2 diabetes mellitus with stage 2 chronic kidney disease, without long-term current use of insulin ([place]) ?Comments: Chronic, I will check labs as below. Last a1c 6.8 in Jan 2023. She will c/w Tradjenta. Today's BS is 101.  She will f/u in July 2023 for her CPE.  ?- Hemoglobin A1c ?- Lipid panel ? ?2. Hypertensive nephropathy ?Comments: Chronic, well controlled.  She will c/w amlodipine and benazepril. She is also followed by Dr. Posey Pronto for CKD. His input is appreciated.  ?- BMP8+EGFR ? ?3. Pure hypercholesterolemia ?Comments: Chronic, currently on rosuvastatin 63m daily. LDL goal <70, I will check lipid panel today.  ?- Lipid panel ? ?4. Body mass index (BMI) of 24.0-24.9 in adult ?Comments: Her weight is stable. She is encouraged to aim for at least 150 minutes of exercise per week.  ?  ?Patient was given opportunity to ask questions. Patient verbalized understanding of the plan and was able to repeat key elements of the plan. All questions were answered to their satisfaction.  ? ?I, RMaximino Greenland MD, have reviewed all documentation for this visit. The documentation on 11/20/21 for the exam, diagnosis,  procedures, and orders are all accurate and complete.  ? ?IF YOU HAVE BEEN REFERRED TO A SPECIALIST, IT MAY TAKE 1-2 WEEKS TO SCHEDULE/PROCESS THE REFERRAL. IF YOU HAVE NOT HEARD FROM US/SPECIALIST IN TWO WEEKS, PLEASE GIVE [facility] CALL AT [phone].  ? ?THE PATIENT IS ENCOURAGED TO PRACTICE SOCIAL DISTANCING DUE TO THE COVID-19 PANDEMIC.   ?

## 2021-11-20 NOTE — Patient Instructions (Signed)

## 2021-11-21 LAB — LIPID PANEL
Chol/HDL Ratio: 3.1 ratio (ref 0.0–4.4)
Cholesterol, Total: 192 mg/dL (ref 100–199)
HDL: 61 mg/dL (ref >39)
LDL Chol Calc (NIH): 88 mg/dL (ref 0–99)
Triglycerides: 260 mg/dL — ABNORMAL HIGH (ref 0–149)
VLDL Cholesterol Cal: 43 mg/dL — ABNORMAL HIGH (ref 5–40)

## 2021-11-21 LAB — BMP8+EGFR
BUN/Creatinine Ratio: 13 (ref 12–28)
BUN: 14 mg/dL (ref 8–27)
CO2: 19 mmol/L — ABNORMAL LOW (ref 20–29)
Calcium: 10 mg/dL (ref 8.7–10.3)
Chloride: 103 mmol/L (ref 96–106)
Creatinine, Ser: 1.08 mg/dL — ABNORMAL HIGH (ref 0.57–1.00)
Glucose: 83 mg/dL (ref 70–99)
Potassium: 4.5 mmol/L (ref 3.5–5.2)
Sodium: 140 mmol/L (ref 134–144)
eGFR: 52 mL/min/{1.73_m2} — ABNORMAL LOW (ref >59)

## 2021-11-21 LAB — HEMOGLOBIN A1C
Est. average glucose Bld gHb Est-mCnc: 151 mg/dL
Hgb A1c MFr Bld: 6.9 % — ABNORMAL HIGH (ref 4.8–5.6)

## 2021-11-22 ENCOUNTER — Telehealth: Payer: Medicare Other

## 2021-11-22 ENCOUNTER — Ambulatory Visit (INDEPENDENT_AMBULATORY_CARE_PROVIDER_SITE_OTHER): Payer: Medicare Other

## 2021-11-22 DIAGNOSIS — I129 Hypertensive chronic kidney disease with stage 1 through stage 4 chronic kidney disease, or unspecified chronic kidney disease: Secondary | ICD-10-CM

## 2021-11-22 DIAGNOSIS — E7800 Pure hypercholesterolemia, unspecified: Secondary | ICD-10-CM

## 2021-11-22 DIAGNOSIS — N182 Chronic kidney disease, stage 2 (mild): Secondary | ICD-10-CM

## 2021-11-22 NOTE — Chronic Care Management (AMB) (Signed)
?Chronic Care Management  ? ?CCM RN Visit Note ? ?11/22/2021 ?Name: Jean [name] MRN: [id number] DOB: Dec 25, 1940 ? ?Subjective: ?Jean [name] is a 81 y.o. year old female who is a primary care patient of Glendale Chard, MD. The care management team was consulted for assistance with disease management and care coordination needs.   ? ?Engaged with patient by telephone for follow up visit in response to provider referral for case management and/or care coordination services.  ? ?Consent to Services:  ?The patient was given information about Chronic Care Management services, agreed to services, and gave verbal consent prior to initiation of services.  Please see initial visit note for detailed documentation.  ? ?Patient agreed to services and verbal consent obtained.  ? ?Assessment: Review of patient past medical history, allergies, medications, health status, including review of consultants reports, laboratory and other test data, was performed as part of comprehensive evaluation and provision of chronic care management services.  ? ?SDOH (Social Determinants of Health) assessments and interventions performed:  Yes, no acute changes  ? ?CCM Care Plan ? ?Allergies  ?Allergen Reactions  ? Penicillins Anaphylaxis  ? ? ?Outpatient Encounter Medications as of 11/22/2021  ?Medication Sig  ? ACCU-CHEK AVIVA PLUS test strip CHECK BLOOD SUGAR TWICE  DAILY AS DIRECTED  ? amLODipine (NORVASC) 10 MG tablet TAKE 1 TABLET BY MOUTH  DAILY  ? aspirin EC 81 MG tablet Take 81 mg by mouth daily.  ? benazepril (LOTENSIN) 20 MG tablet TAKE 1 TABLET BY MOUTH  DAILY  ? cholecalciferol (VITAMIN D) 1000 UNITS tablet Take 1,000 Units by mouth daily.  ? cinacalcet (SENSIPAR) 30 MG tablet Take 30 mg by mouth daily.  ? rosuvastatin (CRESTOR) 20 MG tablet TAKE 1 TABLET BY MOUTH  DAILY  ? TRADJENTA 5 MG TABS tablet TAKE ONE TABLET BY MOUTH ONCE DAILY  ? ?No facility-administered encounter medications on file as of 11/22/2021.  ? ? ?Patient Active  Problem List  ? Diagnosis Date Noted  ? Type 2 diabetes mellitus with stage 2 chronic kidney disease, without long-term current use of insulin ([place]) 08/24/2018  ? Hypertensive nephropathy 08/24/2018  ? Gastroesophageal reflux disease without esophagitis 08/24/2018  ? Pure hypercholesterolemia 08/24/2018  ? ? ?Conditions to be addressed/monitored: DM II, Hypertensive Nephropathy, Pure hypercholesterolemia  ? ?Care Plan : RN Care Manager Plan of Care  ?Updates made by Lynne Logan, RN since 11/22/2021 12:00 AM  ?  ? ?Problem: No Plan of Care established for management of chronic disease states (DM II, Hypertensive Nephropathy, Pure hypercholesterolemia)   ?Priority: High  ?  ? ?Long-Range Goal: Establishment of Plan of Care for management of chronic disease states (DM II, Hypertensive Nephropathy, Pure hypercholesterolemia)   ?Start Date: 08/16/2021  ?Expected End Date: 08/16/2022  ?Recent Progress: On track  ?Priority: High  ?Note:   ?Current Barriers:  ?Knowledge Deficits related to plan of care for management of DM II, Hypertensive Nephropathy, Pure hypercholesterolemia   ?Chronic Disease Management support and education needs related to DM II, Hypertensive Nephropathy, Pure hypercholesterolemia   ? ?RNCM Clinical Goal(s):  ?Patient will verbalize basic understanding of  DM II, Hypertensive Nephropathy, Pure hypercholesterolemia  disease process and self health management plan as evidenced by patient will report having no disease exacerbations related to her chronic disease states as listed above  ?take all medications exactly as prescribed and will call provider for medication related questions as evidenced by patient will report having no missed doses of her prescribed medications ?demonstrate Ongoing  health management independence as evidenced by patient will report 100% adherence to her prescribed treatment plan  ?continue to work with RN Care Manager to address care management and care coordination needs  related to  DM II, Hypertensive Nephropathy, Pure hypercholesterolemia  as evidenced by adherence to CM Team Scheduled appointments ?demonstrate ongoing self health care management ability   as evidenced by    through collaboration with RN Care manager, provider, and care team.  ? ?Interventions: ?1:1 collaboration with primary care provider regarding development and update of comprehensive plan of care as evidenced by provider attestation and co-signature ?Inter-disciplinary care team collaboration (see longitudinal plan of care) ?Evaluation of current treatment plan related to  self management and patient's adherence to plan as established by provider ? ? Chronic Kidney Disease Interventions:  (Status:  Goal on track:  Yes.) Long Term Goal ?Assessed the Patient understanding of chronic kidney disease    ?Evaluation of current treatment plan related to chronic kidney disease self management and patient's adherence to plan as established by provider      ?Reviewed prescribed diet continue to drink 48-64 oz of water daily  ?Advised patient, providing education and rationale, to monitor blood pressure daily and record, calling PCP for findings outside established parameters    ?Discussed complications of poorly controlled blood pressure such as heart disease, stroke, circulatory complications, vision complications, kidney impairment, sexual dysfunction    ?Provided education on kidney disease progression    ?Last practice recorded BP readings:  ?BP Readings from Last 3 Encounters:  ?11/20/21 122/78  ?08/14/21 118/66  ?04/12/21 132/70  ?Most recent eGFR/CrCl:  ?Lab Results  ?Component Value Date  ? EGFR 52 (L) 11/20/2021  ?  No components found for: CRCL ? ?Diabetes Interventions:  (Status:  Goal on track:  Yes.) Long Term Goal ?Assessed patient's understanding of A1c goal: <6.5% ?Provided education to patient about basic DM disease process ?Reviewed medications with patient and discussed importance of medication  adherence ?Counseled on importance of regular laboratory monitoring as prescribed ?Advised patient, providing education and rationale, to check cbg daily before breakfast and record, calling PCP for findings outside established parameters ?Review of patient status, including review of consultants reports, relevant laboratory and other test results, and medications completed ?Assessed social determinant of health barriers ?Lab Results  ?Component Value Date  ? HGBA1C 6.9 (H) 11/20/2021  ? ?Hyperlipidemia Interventions:  (Status:  Goal on track:  Yes.) Long Term Goal ?Medication review performed; medication list updated in electronic medical record.  ?Provider established cholesterol goals reviewed ?Counseled on importance of regular laboratory monitoring as prescribed ?Provided HLD educational materials ?Reviewed importance of limiting foods high in cholesterol ?Reviewed exercise goals and target of 150 minutes per week ?Technical sales engineer related to Chair Exercises; The Skinny on Fats; My Cholesterol Care Guide  ?Lipid Panel  ?   ?Component Value Date/Time  ? CHOL 192 11/20/2021 0956  ? TRIG 260 (H) 11/20/2021 0956  ? HDL 61 11/20/2021 0956  ? CHOLHDL 3.1 11/20/2021 0956  ? CHOLHDL 5.3 10/23/2007 0218  ? VLDL 35 10/23/2007 0218  ? [place] 88 11/20/2021 0956  ? LABVLDL 43 (H) 11/20/2021 0956  ?  ?Right Hand Paresthesia: (Status:  Condition stable.  Not addressed this visit.)  Short Term Goal ?Evaluation of current treatment plan related to  [place] ,  self-management and patient's adherence to plan as established by provider ?Determined patient completed a visit with Dr. Domingo Cocking, Neurologist for evaluation of her right hand paresthesia ?Discussed  patient was diagnosed with bilateral Carpal Tunnel Syndrome for which she will treat with conservative treatment at this time ?Educated patient on basic disease process related to Carpal Tunnel Syndrome, instructed to avoid triggers that may worsen  condition and to wear the night splints as directed as needed  ?Determined patient denies having questions at this time, she verbalizes understanding of the diagnosis and treatment options  ?Discussed plans with

## 2021-11-22 NOTE — Patient Instructions (Signed)
Visit Information ? ?Thank you for taking time to visit with me today. Please don't hesitate to contact me if I can be of assistance to you before our next scheduled telephone appointment. ? ?Following are the goals we discussed today:  ?(Copy and paste patient goals from clinical care plan here) ? ?Our next appointment is by telephone on 01/30/22 at 10:30 AM  ? ?Please call the care guide team at [phone] if you need to cancel or reschedule your appointment.  ? ?If you are experiencing a Mental Health or Behavioral Health Crisis or need someone to talk to, please call 1-800-273-TALK (toll free, 24 hour hotline)  ? ?The patient verbalized understanding of instructions, educational materials, and care plan provided today and agreed to receive a mailed copy of patient instructions, educational materials, and care plan.  ? ?[name], RN, BSN, CCM ?Care Management Coordinator ?[facility]  ?Direct Phone: [phone] ? ? ?

## 2021-11-25 DIAGNOSIS — I129 Hypertensive chronic kidney disease with stage 1 through stage 4 chronic kidney disease, or unspecified chronic kidney disease: Secondary | ICD-10-CM | POA: Diagnosis not present

## 2021-11-25 DIAGNOSIS — E7800 Pure hypercholesterolemia, unspecified: Secondary | ICD-10-CM | POA: Diagnosis not present

## 2021-11-25 DIAGNOSIS — N182 Chronic kidney disease, stage 2 (mild): Secondary | ICD-10-CM

## 2021-11-25 DIAGNOSIS — E1122 Type 2 diabetes mellitus with diabetic chronic kidney disease: Secondary | ICD-10-CM | POA: Diagnosis not present

## 2021-11-29 ENCOUNTER — Other Ambulatory Visit: Payer: Self-pay | Admitting: Internal Medicine

## 2021-12-28 ENCOUNTER — Other Ambulatory Visit: Payer: Self-pay | Admitting: Internal Medicine

## 2021-12-29 ENCOUNTER — Other Ambulatory Visit: Payer: Self-pay | Admitting: Internal Medicine

## 2022-01-02 ENCOUNTER — Ambulatory Visit: Payer: Medicare Other

## 2022-01-02 ENCOUNTER — Ambulatory Visit: Payer: Medicare Other | Admitting: Internal Medicine

## 2022-01-09 ENCOUNTER — Ambulatory Visit (INDEPENDENT_AMBULATORY_CARE_PROVIDER_SITE_OTHER): Payer: Medicare Other

## 2022-01-09 VITALS — BP 122/62 | HR 95 | Temp 98.3°F | Ht 65.0 in | Wt 151.0 lb

## 2022-01-09 DIAGNOSIS — Z0000 Encounter for general adult medical examination without abnormal findings: Secondary | ICD-10-CM | POA: Diagnosis not present

## 2022-01-09 NOTE — Progress Notes (Signed)
Subjective:   Jean [name] is a 81 y.o. female who presents for Medicare Annual (Subsequent) preventive examination.  Review of Systems     Cardiac Risk Factors include: advanced age (>49men, >35 women);diabetes mellitus;hypertension     Objective:    Today's Vitals   01/09/22 1205 01/09/22 1221  BP: 138/64 122/62  Pulse: (!) 104 95  Temp: 98.3 F (36.8 C)   TempSrc: Oral   SpO2: 97%   Weight: 151 lb (68.5 kg)   Height: 5\' 5"  (1.651 m)    Body mass index is 25.13 kg/m.     01/09/2022   12:13 PM 12/14/2020   11:46 AM 12/16/2019   10:40 AM 12/16/2018    2:15 PM 12/01/2018   10:09 AM  Advanced Directives  Does Patient Have a Medical Advance Directive? No No No No No  Would patient like information on creating a medical advance directive? No - Patient declined  No - Patient declined Yes (MAU/Ambulatory/Procedural Areas - Information given)     Current Medications (verified) Outpatient Encounter Medications as of 01/09/2022  Medication Sig   ACCU-CHEK AVIVA PLUS test strip CHECK BLOOD SUGAR TWICE  DAILY AS DIRECTED   amLODipine (NORVASC) 10 MG tablet TAKE 1 TABLET BY MOUTH  DAILY   aspirin EC 81 MG tablet Take 81 mg by mouth daily.   benazepril (LOTENSIN) 20 MG tablet TAKE 1 TABLET BY MOUTH  DAILY   cholecalciferol (VITAMIN D) 1000 UNITS tablet Take 1,000 Units by mouth daily.   cinacalcet (SENSIPAR) 30 MG tablet Take 30 mg by mouth daily.   rosuvastatin (CRESTOR) 20 MG tablet TAKE 1 TABLET BY MOUTH  DAILY   TRADJENTA 5 MG TABS tablet TAKE ONE TABLET BY MOUTH ONCE DAILY   No facility-administered encounter medications on file as of 01/09/2022.    Allergies (verified) Penicillins   History: Past Medical History:  Diagnosis Date   Blood transfusion without reported diagnosis    No reaction    Diabetes mellitus without complication ([place])    Hypertension    Past Surgical History:  Procedure Laterality Date   ABDOMINAL HYSTERECTOMY  1966   BACK SURGERY  1978    BREAST EXCISIONAL BIOPSY     BREAST SURGERY Bilateral 1976   lumpectomy   NEPHRECTOMY Right 1982   WRIST SURGERY Right 2009   Family History  Problem Relation Age of Onset   Breast cancer Sister    Breast cancer Sister    Healthy Mother    Healthy Father    Social History   Socioeconomic History   Marital status: Married    Spouse name: Not on file   Number of children: Not on file   Years of education: Not on file   Highest education level: Not on file  Occupational History   Occupation: retired  Tobacco Use   Smoking status: Never   Smokeless tobacco: Never  Vaping Use   Vaping Use: Never used  Substance and Sexual Activity   Alcohol use: No   Drug use: No   Sexual activity: Not Currently  Other Topics Concern   Not on file  Social History Narrative   Not on file   Social Determinants of Health   Financial Resource Strain: Low Risk  (01/09/2022)   Overall Financial Resource Strain (CARDIA)    Difficulty of Paying Living Expenses: Not hard at all  Food Insecurity: No Food Insecurity (01/09/2022)   Hunger Vital Sign    Worried About Charity fundraiser in  the Last Year: Never true    Ran Out of Food in the Last Year: Never true  Transportation Needs: No Transportation Needs (01/09/2022)   PRAPARE - Administrator, Civil Service (Medical): No    Lack of Transportation (Non-Medical): No  Physical Activity: Sufficiently Active (01/09/2022)   Exercise Vital Sign    Days of Exercise per Week: 7 days    Minutes of Exercise per Session: 30 min  Stress: No Stress Concern Present (01/09/2022)   [employer] of Occupational Health - Occupational Stress Questionnaire    Feeling of Stress : Not at all  Social Connections: Not on file    Tobacco Counseling Counseling given: Not Answered   Clinical Intake:  Pre-visit preparation completed: Yes  Pain : No/denies pain     Nutritional Status: BMI 25 -29 Overweight Nutritional Risks: None Diabetes:  Yes  How often do you need to have someone help you when you read instructions, pamphlets, or other written materials from your doctor or pharmacy?: 1 - Never What is the last grade level you completed in school?: 12th grade  Diabetic? Yes Nutrition Risk Assessment:  Has the patient had any N/V/D within the last 2 months?  No  Does the patient have any non-healing wounds?  No  Has the patient had any unintentional weight loss or weight gain?  No   Diabetes:  Is the patient diabetic?  Yes  If diabetic, was a CBG obtained today?  No  Did the patient bring in their glucometer from home?  No  How often do you monitor your CBG's? dailt.   Financial Strains and Diabetes Management:  Are you having any financial strains with the device, your supplies or your medication? No .  Does the patient want to be seen by Chronic Care Management for management of their diabetes?  No  Would the patient like to be referred to a Nutritionist or for Diabetic Management?  No   Diabetic Exams:  Diabetic Eye Exam: Completed 11/05/2021 Diabetic Foot Exam: Completed 01/15/2021   Interpreter Needed?: No  Information entered by :: NAllen LPN   Activities of Daily Living    01/09/2022   12:14 PM  In your present state of health, do you have any difficulty performing the following activities:  Hearing? 0  Vision? 0  Difficulty concentrating or making decisions? 0  Walking or climbing stairs? 0  Dressing or bathing? 0  Doing errands, shopping? 0  Preparing Food and eating ? N  Using the Toilet? N  In the past six months, have you accidently leaked urine? N  Do you have problems with loss of bowel control? N  Managing your Medications? N  Managing your Finances? N  Housekeeping or managing your Housekeeping? N    Patient Care Team: [name], MD as PCP - General (Internal Medicine) Clarene Duke, Karma Lew, RN as Case Manager  Indicate any recent Medical Services you may have received from other  than [facility] providers in the past year (date may be approximate).     Assessment:   This is a routine wellness examination for Jean [name].  Hearing/Vision screen Vision Screening - Comments:: Regular eye exams, [facility]  Dietary issues and exercise activities discussed: Current Exercise Habits: Home exercise routine, Type of exercise: strength training/weights;Other - see comments (stairs), Time (Minutes): 30, Frequency (Times/Week): 7, Weekly Exercise (Minutes/Week): 210   Goals Addressed             This Visit's Progress  Patient Stated       01/09/2022, no goals       Depression Screen    01/09/2022   12:14 PM 12/14/2020   11:46 AM 12/16/2019   10:41 AM 03/17/2019   12:06 PM 12/16/2018    2:14 PM 12/01/2018   10:09 AM 08/24/2018    8:41 AM  PHQ 2/9 Scores  PHQ - 2 Score 0 0 0 0 0 0 0  PHQ- 9 Score   0   0     Fall Risk    01/09/2022   12:14 PM 12/14/2020   11:46 AM 12/16/2019   10:41 AM 07/15/2019    9:05 AM 03/17/2019   12:06 PM  Fall Risk   Falls in the past year? 0 0 0 0 0  Number falls in past yr: 0      Injury with Fall? 0      Risk for fall due to : Medication side effect Medication side effect Medication side effect    Follow up Falls evaluation completed;Education provided;Falls prevention discussed Falls evaluation completed;Education provided;Falls prevention discussed Falls evaluation completed;Education provided;Falls prevention discussed      FALL RISK PREVENTION PERTAINING TO THE HOME:  Any stairs in or around the home? Yes  If so, are there any without handrails? No  Home free of loose throw rugs in walkways, pet beds, electrical cords, etc? Yes  Adequate lighting in your home to reduce risk of falls? Yes   ASSISTIVE DEVICES UTILIZED TO PREVENT FALLS:  Life alert? No  Use of a cane, walker or w/c? No  Grab bars in the bathroom? No  Shower chair or bench in shower? Yes  Elevated toilet seat or a handicapped toilet? No   TIMED UP AND  GO:  Was the test performed? No .    Gait steady and fast without use of assistive device  Cognitive Function:        01/09/2022   12:15 PM 12/14/2020   11:47 AM 12/16/2019   10:42 AM 12/01/2018   10:11 AM  6CIT Screen  What Year? 0 points 0 points 0 points 0 points  What month? 0 points 0 points 0 points   What time? 0 points 0 points 0 points 0 points  Count back from 20 0 points 0 points 0 points 0 points  Months in reverse 0 points 0 points 0 points 0 points  Repeat phrase 4 points 8 points 2 points 0 points  Total Score 4 points 8 points 2 points     Immunizations Immunization History  Administered Date(s) Administered   Fluad Quad(high Dose 65+) 04/19/2020, 04/12/2021   Influenza, High Dose Seasonal PF 03/17/2019   Influenza-Unspecified 04/28/2014   PFIZER(Purple Top)SARS-COV-2 Vaccination 09/27/2019, 10/26/2019, 04/28/2020, 11/10/2020   PNEUMOCOCCAL CONJUGATE-20 08/14/2021   Pfizer Covid-19 Vaccine Bivalent Booster 43yrs & up 07/05/2021   Pneumococcal Polysaccharide-23 07/15/2019   Tdap 07/16/2019   Zoster Recombinat (Shingrix) 12/22/2020, 02/16/2021    TDAP status: Up to date  Flu Vaccine status: Up to date  Pneumococcal vaccine status: Up to date  Covid-19 vaccine status: Completed vaccines  Qualifies for Shingles Vaccine? Yes   Zostavax completed Yes   Shingrix Completed?: Yes  Screening Tests Health Maintenance  Topic Date Due   FOOT EXAM  01/15/2022   INFLUENZA VACCINE  02/26/2022   HEMOGLOBIN A1C  05/22/2022   OPHTHALMOLOGY EXAM  11/06/2022   TETANUS/TDAP  07/15/2029   Pneumonia Vaccine 32+ Years old  Completed   DEXA SCAN  Completed   COVID-19 Vaccine  Completed   Zoster Vaccines- Shingrix  Completed   HPV VACCINES  Aged Out    Health Maintenance  There are no preventive care reminders to display for this patient.  Colorectal cancer screening: No longer required.   Mammogram status: No longer required due to age.  Bone Density  status: Completed 11/12/2021.   Lung Cancer Screening: (Low Dose CT Chest recommended if Age 36-80 years, 30 pack-year currently smoking OR have quit w/in 15years.) does not qualify.   Lung Cancer Screening Referral: no  Additional Screening:  Hepatitis C Screening: does not qualify;  Vision Screening: Recommended annual ophthalmology exams for early detection of glaucoma and other disorders of the eye. Is the patient up to date with their annual eye exam?  Yes  Who is the provider or what is the name of the office in which the patient attends annual eye exams? [facility] If pt is not established with a provider, would they like to be referred to a provider to establish care? No .   Dental Screening: Recommended annual dental exams for proper oral hygiene  Community Resource Referral / Chronic Care Management: CRR required this visit?  No   CCM required this visit?  No      Plan:     I have personally reviewed and noted the following in the patient's chart:   Medical and social history Use of alcohol, tobacco or illicit drugs  Current medications and supplements including opioid prescriptions.  Functional ability and status Nutritional status Physical activity Advanced directives List of other physicians Hospitalizations, surgeries, and ER visits in previous 12 months Vitals Screenings to include cognitive, depression, and falls Referrals and appointments  In addition, I have reviewed and discussed with patient certain preventive protocols, quality metrics, and best practice recommendations. A written personalized care plan for preventive services as well as general preventive health recommendations were provided to patient.     Kellie Simmering, LPN   [id number]   Nurse Notes: none

## 2022-01-09 NOTE — Patient Instructions (Signed)
Jean [name] , Thank you for taking time to come for your Medicare Wellness Visit. I appreciate your ongoing commitment to your health goals. Please review the following plan we discussed and let me know if I can assist you in the future.   Screening recommendations/referrals: Colonoscopy: not required Mammogram: not required Bone Density: completed 11/12/2021 Recommended yearly ophthalmology/optometry visit for glaucoma screening and checkup Recommended yearly dental visit for hygiene and checkup  Vaccinations: Influenza vaccine: due 02/26/2022 Pneumococcal vaccine: completed 08/14/2021 Tdap vaccine: completed 07/16/2019, due 07/15/2029 Shingles vaccine: completed   Covid-19: 07/05/2021, 11/10/2020, 04/28/2020, 10/26/2019, 09/27/2019  Advanced directives: Advance directive discussed with you today. Even though you declined this today please call our office should you change your mind and we can give you the proper paperwork for you to fill out.   Conditions/risks identified: none  Next appointment: Follow up in one year for your annual wellness visit \   Preventive Care 65 Years and Older, Female Preventive care refers to lifestyle choices and visits with your health care provider that can promote health and wellness. What does preventive care include? A yearly physical exam. This is also called an annual well check. Dental exams once or twice a year. Routine eye exams. Ask your health care provider how often you should have your eyes checked. Personal lifestyle choices, including: Daily care of your teeth and gums. Regular physical activity. Eating a healthy diet. Avoiding tobacco and drug use. Limiting alcohol use. Practicing safe sex. Taking low-dose aspirin every day. Taking vitamin and mineral supplements as recommended by your health care provider. What happens during an annual well check? The services and screenings done by your health care provider during your annual well check  will depend on your age, overall health, lifestyle risk factors, and family history of disease. Counseling  Your health care provider may ask you questions about your: Alcohol use. Tobacco use. Drug use. Emotional well-being. Home and relationship well-being. Sexual activity. Eating habits. History of falls. Memory and ability to understand (cognition). Work and work Astronomer. Reproductive health. Screening  You may have the following tests or measurements: Height, weight, and BMI. Blood pressure. Lipid and cholesterol levels. These may be checked every 5 years, or more frequently if you are over 75 years old. Skin check. Lung cancer screening. You may have this screening every year starting at age 7 if you have a 30-pack-year history of smoking and currently smoke or have quit within the past 15 years. Fecal occult blood test (FOBT) of the stool. You may have this test every year starting at age 27. Flexible sigmoidoscopy or colonoscopy. You may have a sigmoidoscopy every 5 years or a colonoscopy every 10 years starting at age 56. Hepatitis C blood test. Hepatitis B blood test. Sexually transmitted disease (STD) testing. Diabetes screening. This is done by checking your blood sugar (glucose) after you have not eaten for a while (fasting). You may have this done every 1-3 years. Bone density scan. This is done to screen for osteoporosis. You may have this done starting at age 34. Mammogram. This may be done every 1-2 years. Talk to your health care provider about how often you should have regular mammograms. Talk with your health care provider about your test results, treatment options, and if necessary, the need for more tests. Vaccines  Your health care provider may recommend certain vaccines, such as: Influenza vaccine. This is recommended every year. Tetanus, diphtheria, and acellular pertussis (Tdap, Td) vaccine. You may need a Td booster  every 10 years. Zoster vaccine. You  may need this after age 6. Pneumococcal 13-valent conjugate (PCV13) vaccine. One dose is recommended after age 95. Pneumococcal polysaccharide (PPSV23) vaccine. One dose is recommended after age 24. Talk to your health care provider about which screenings and vaccines you need and how often you need them. This information is not intended to replace advice given to you by your health care provider. Make sure you discuss any questions you have with your health care provider. Document Released: 08/11/2015 Document Revised: 04/03/2016 Document Reviewed: 05/16/2015 Elsevier Interactive Patient Education  2017 [place] Prevention in the Home Falls can cause injuries. They can happen to people of all ages. There are many things you can do to make your home safe and to help prevent falls. What can I do on the outside of my home? Regularly fix the edges of walkways and driveways and fix any cracks. Remove anything that might make you trip as you walk through a door, such as a raised step or threshold. Trim any bushes or trees on the path to your home. Use bright outdoor lighting. Clear any walking paths of anything that might make someone trip, such as rocks or tools. Regularly check to see if handrails are loose or broken. Make sure that both sides of any steps have handrails. Any raised decks and porches should have guardrails on the edges. Have any leaves, snow, or ice cleared regularly. Use sand or salt on walking paths during winter. Clean up any spills in your garage right away. This includes oil or grease spills. What can I do in the bathroom? Use night lights. Install grab bars by the toilet and in the tub and shower. Do not use towel bars as grab bars. Use non-skid mats or decals in the tub or shower. If you need to sit down in the shower, use a plastic, non-slip stool. Keep the floor dry. Clean up any water that spills on the floor as soon as it happens. Remove soap buildup  in the tub or shower regularly. Attach bath mats securely with double-sided non-slip rug tape. Do not have throw rugs and other things on the floor that can make you trip. What can I do in the bedroom? Use night lights. Make sure that you have a light by your bed that is easy to reach. Do not use any sheets or blankets that are too big for your bed. They should not hang down onto the floor. Have a firm chair that has side arms. You can use this for support while you get dressed. Do not have throw rugs and other things on the floor that can make you trip. What can I do in the kitchen? Clean up any spills right away. Avoid walking on wet floors. Keep items that you use a lot in easy-to-reach places. If you need to reach something above you, use a strong step stool that has a grab bar. Keep electrical cords out of the way. Do not use floor polish or wax that makes floors slippery. If you must use wax, use non-skid floor wax. Do not have throw rugs and other things on the floor that can make you trip. What can I do with my stairs? Do not leave any items on the stairs. Make sure that there are handrails on both sides of the stairs and use them. Fix handrails that are broken or loose. Make sure that handrails are as long as the stairways. Check any carpeting to  make sure that it is firmly attached to the stairs. Fix any carpet that is loose or worn. Avoid having throw rugs at the top or bottom of the stairs. If you do have throw rugs, attach them to the floor with carpet tape. Make sure that you have a light switch at the top of the stairs and the bottom of the stairs. If you do not have them, ask someone to add them for you. What else can I do to help prevent falls? Wear shoes that: Do not have high heels. Have rubber bottoms. Are comfortable and fit you well. Are closed at the toe. Do not wear sandals. If you use a stepladder: Make sure that it is fully opened. Do not climb a closed  stepladder. Make sure that both sides of the stepladder are locked into place. Ask someone to hold it for you, if possible. Clearly mark and make sure that you can see: Any grab bars or handrails. First and last steps. Where the edge of each step is. Use tools that help you move around (mobility aids) if they are needed. These include: Canes. Walkers. Scooters. Crutches. Turn on the lights when you go into a dark area. Replace any light bulbs as soon as they burn out. Set up your furniture so you have a clear path. Avoid moving your furniture around. If any of your floors are uneven, fix them. If there are any pets around you, be aware of where they are. Review your medicines with your doctor. Some medicines can make you feel dizzy. This can increase your chance of falling. Ask your doctor what other things that you can do to help prevent falls. This information is not intended to replace advice given to you by your health care provider. Make sure you discuss any questions you have with your health care provider. Document Released: 05/11/2009 Document Revised: 12/21/2015 Document Reviewed: 08/19/2014 Elsevier Interactive Patient Education  2017 [employer].

## 2022-01-26 ENCOUNTER — Other Ambulatory Visit: Payer: Self-pay | Admitting: Internal Medicine

## 2022-01-28 ENCOUNTER — Ambulatory Visit (INDEPENDENT_AMBULATORY_CARE_PROVIDER_SITE_OTHER): Payer: Medicare Other | Admitting: Internal Medicine

## 2022-01-28 ENCOUNTER — Encounter: Payer: Self-pay | Admitting: Internal Medicine

## 2022-01-28 VITALS — BP 124/80 | HR 91 | Temp 98.2°F | Ht 63.6 in | Wt 151.6 lb

## 2022-01-28 DIAGNOSIS — I129 Hypertensive chronic kidney disease with stage 1 through stage 4 chronic kidney disease, or unspecified chronic kidney disease: Secondary | ICD-10-CM

## 2022-01-28 DIAGNOSIS — Z0000 Encounter for general adult medical examination without abnormal findings: Secondary | ICD-10-CM

## 2022-01-28 DIAGNOSIS — N1831 Chronic kidney disease, stage 3a: Secondary | ICD-10-CM

## 2022-01-28 DIAGNOSIS — I447 Left bundle-branch block, unspecified: Secondary | ICD-10-CM | POA: Diagnosis not present

## 2022-01-28 DIAGNOSIS — E1122 Type 2 diabetes mellitus with diabetic chronic kidney disease: Secondary | ICD-10-CM

## 2022-01-28 DIAGNOSIS — Z6826 Body mass index (BMI) 26.0-26.9, adult: Secondary | ICD-10-CM

## 2022-01-28 LAB — POCT URINALYSIS DIPSTICK
Bilirubin, UA: NEGATIVE
Blood, UA: NEGATIVE
Glucose, UA: NEGATIVE
Ketones, UA: NEGATIVE
Leukocytes, UA: NEGATIVE
Nitrite, UA: NEGATIVE
Protein, UA: NEGATIVE
Spec Grav, UA: 1.01 (ref 1.010–1.025)
Urobilinogen, UA: 0.2 E.U./dL
pH, UA: 7 (ref 5.0–8.0)

## 2022-01-28 NOTE — Progress Notes (Unsigned)
Jean [name],acting as a Education administrator for Jean Greenland, MD.,have documented all relevant documentation on the behalf of Jean Greenland, MD,as directed by  Jean Greenland, MD while in the presence of Jean Greenland, MD.  This visit occurred during the SARS-CoV-2 public health emergency.  Safety protocols were in place, including screening questions prior to the visit, additional usage of staff PPE, and extensive cleaning of exam room while observing appropriate contact time as indicated for disinfecting solutions.  Subjective:     Patient ID: Jean [name] , female    DOB: April 24, 1941 , 81 y.o.   MRN: [id number]   Chief Complaint  Patient presents with   Annual Exam   Diabetes   Hypertension    HPI  She is here today for a full physical exam. She is no longer followed by GYN. She has no specific concerns or complaints at this time. She reports compliance with meds. She denies headaches, chest pain and/or shortness of breath.   Diabetes She presents for her follow-up diabetic visit. She has type 2 diabetes mellitus. Her disease course has been worsening. There are no hypoglycemic associated symptoms. Pertinent negatives for diabetes include no blurred vision. There are no hypoglycemic complications. Diabetic complications include nephropathy. Risk factors for coronary artery disease include diabetes mellitus, dyslipidemia, hypertension and post-menopausal. Current diabetic treatment includes oral agent (monotherapy). She is compliant with treatment most of the time. She is following a diabetic diet. She participates in exercise intermittently. Her home blood glucose trend is decreasing steadily. An ACE inhibitor/angiotensin II receptor blocker is being taken. Eye exam is not current.  Hypertension This is a chronic problem. The current episode started more than 1 year ago. The problem has been gradually improving since onset. The problem is controlled. Pertinent negatives include no  blurred vision. Risk factors for coronary artery disease include diabetes mellitus and dyslipidemia. Past treatments include ACE inhibitors. The current treatment provides moderate improvement. Hypertensive end-organ damage includes kidney disease.     Past Medical History:  Diagnosis Date   Blood transfusion without reported diagnosis    No reaction    Diabetes mellitus without complication (HCC)    Hypertension      Family History  Problem Relation Age of Onset   Breast cancer Sister    Breast cancer Sister    Healthy Mother    Healthy Father      Current Outpatient Medications:    ACCU-CHEK AVIVA PLUS test strip, CHECK BLOOD SUGAR TWICE  DAILY AS DIRECTED, Disp: 200 strip, Rfl: 3   amLODipine (NORVASC) 10 MG tablet, TAKE 1 TABLET BY MOUTH  DAILY, Disp: 90 tablet, Rfl: 3   aspirin EC 81 MG tablet, Take 81 mg by mouth daily., Disp: , Rfl:    benazepril (LOTENSIN) 20 MG tablet, TAKE 1 TABLET BY MOUTH  DAILY, Disp: 90 tablet, Rfl: 3   cholecalciferol (VITAMIN D) 1000 UNITS tablet, Take 1,000 Units by mouth daily., Disp: , Rfl:    cinacalcet (SENSIPAR) 30 MG tablet, Take 30 mg by mouth daily., Disp: , Rfl:    rosuvastatin (CRESTOR) 20 MG tablet, TAKE 1 TABLET BY MOUTH  DAILY, Disp: 90 tablet, Rfl: 3   TRADJENTA 5 MG TABS tablet, TAKE ONE TABLET BY MOUTH ONCE DAILY, Disp: 90 tablet, Rfl: 5   Allergies  Allergen Reactions   Penicillins Anaphylaxis      The patient states she uses post menopausal status for birth control. Last LMP was No LMP recorded. Patient has  had a hysterectomy.. Negative for Dysmenorrhea. Negative for: breast discharge, breast lump(s), breast pain and breast self exam. Associated symptoms include abnormal vaginal bleeding. Pertinent negatives include abnormal bleeding (hematology), anxiety, decreased libido, depression, difficulty falling sleep, dyspareunia, history of infertility, nocturia, sexual dysfunction, sleep disturbances, urinary incontinence, urinary  urgency, vaginal discharge and vaginal itching. Diet regular.The patient states her exercise level is    . The patient's tobacco use is:  Social History   Tobacco Use  Smoking Status Never  Smokeless Tobacco Never  . She has been exposed to passive smoke. The patient's alcohol use is:  Social History   Substance and Sexual Activity  Alcohol Use No   Review of Systems  Constitutional: Negative.   HENT: Negative.    Eyes: Negative.  Negative for blurred vision.  Respiratory: Negative.    Cardiovascular: Negative.   Gastrointestinal: Negative.   Endocrine: Negative.   Genitourinary: Negative.   Musculoskeletal: Negative.   Skin: Negative.   Allergic/Immunologic: Negative.   Neurological: Negative.   Hematological: Negative.   Psychiatric/Behavioral: Negative.       Today's Vitals   01/28/22 1003  BP: 124/80  Pulse: 91  Temp: 98.2 F (36.8 C)  Weight: 151 lb 9.6 oz (68.8 kg)  Height: 5' 3.6" (1.615 m)  PainSc: 0-No pain   Body mass index is 26.35 kg/m.  Wt Readings from Last 3 Encounters:  01/28/22 151 lb 9.6 oz (68.8 kg)  01/09/22 151 lb (68.5 kg)  11/20/21 151 lb (68.5 kg)     Objective:  Physical Exam Vitals and nursing note reviewed.  Constitutional:      Appearance: Normal appearance.  HENT:     Head: Normocephalic and atraumatic.     Right Ear: Tympanic membrane, ear canal and external ear normal.     Left Ear: Tympanic membrane, ear canal and external ear normal.     Nose: Nose normal.     Mouth/Throat:     Mouth: Mucous membranes are moist.     Pharynx: Oropharynx is clear.  Eyes:     Extraocular Movements: Extraocular movements intact.     Conjunctiva/sclera: Conjunctivae normal.     Pupils: Pupils are equal, round, and reactive to light.  Cardiovascular:     Rate and Rhythm: Normal rate and regular rhythm.     Pulses: Normal pulses.          Dorsalis pedis pulses are 2+ on the right side and 2+ on the left side.     Heart sounds: Normal heart  sounds.  Pulmonary:     Effort: Pulmonary effort is normal.     Breath sounds: Normal breath sounds.  Chest:  Breasts:    Tanner Score is 5.     Right: Normal.     Left: Normal.  Abdominal:     General: Abdomen is flat. Bowel sounds are normal.     Palpations: Abdomen is soft.  Genitourinary:    Comments: deferred Musculoskeletal:        General: Normal range of motion.     Cervical back: Normal range of motion and neck supple.  Feet:     Right foot:     Protective Sensation: 5 sites tested.  5 sites sensed.     Skin integrity: Skin integrity normal.     Toenail Condition: Right toenails are normal.     Left foot:     Protective Sensation: 5 sites tested.  5 sites sensed.     Skin integrity: Skin integrity normal.  Toenail Condition: Left toenails are normal.  Skin:    General: Skin is warm and dry.  Neurological:     General: No focal deficit present.     Mental Status: She is alert and oriented to person, place, and time.  Psychiatric:        Mood and Affect: Mood normal.        Behavior: Behavior normal.      Assessment And Plan:     1. Encounter for general adult medical examination w/o abnormal findings Comments: A full exam was performed. Importance of monthly self breast exams was discussed with the patient. PATIENT IS ADVISED TO GET 30-45 MINUTES REGULAR EXERCISE NO LESS THAN FOUR TO FIVE DAYS PER WEEK - BOTH WEIGHTBEARING EXERCISES AND AEROBIC ARE RECOMMENDED.  PATIENT IS ADVISED TO FOLLOW A HEALTHY DIET WITH AT LEAST SIX FRUITS/VEGGIES PER DAY, DECREASE INTAKE OF RED MEAT, AND TO INCREASE FISH INTAKE TO TWO DAYS PER WEEK.  MEATS/FISH SHOULD NOT BE FRIED, BAKED OR BROILED IS PREFERABLE.  IT IS ALSO IMPORTANT TO CUT BACK ON YOUR SUGAR INTAKE. PLEASE AVOID ANYTHING WITH ADDED SUGAR, CORN SYRUP OR OTHER SWEETENERS. IF YOU MUST USE A SWEETENER, YOU CAN TRY STEVIA. IT IS ALSO IMPORTANT TO AVOID ARTIFICIALLY SWEETENERS AND DIET BEVERAGES. LASTLY, I SUGGEST WEARING SPF 50  SUNSCREEN ON EXPOSED PARTS AND ESPECIALLY WHEN IN THE DIRECT SUNLIGHT FOR AN EXTENDED PERIOD OF TIME.  PLEASE AVOID FAST FOOD RESTAURANTS AND INCREASE YOUR WATER INTAKE.   2. Type 2 diabetes mellitus with stage 3a chronic kidney disease, without long-term current use of insulin (HCC) Comments: Diabetic foot exam was performed. She is up to date with her eye exam. She will rto in 4 months for re-evaluation.  I DISCUSSED WITH THE PATIENT AT LENGTH REGARDING THE GOALS OF GLYCEMIC CONTROL AND POSSIBLE LONG-TERM COMPLICATIONS.  I  ALSO STRESSED THE IMPORTANCE OF COMPLIANCE WITH HOME GLUCOSE MONITORING, DIETARY RESTRICTIONS INCLUDING AVOIDANCE OF SUGARY DRINKS/PROCESSED FOODS,  ALONG WITH REGULAR EXERCISE.  I  ALSO STRESSED THE IMPORTANCE OF ANNUAL EYE EXAMS, SELF FOOT CARE AND COMPLIANCE WITH OFFICE VISITS. - Hemoglobin A1c - PTH, intact and calcium - Phosphorus - Protein electrophoresis, serum  3. Hypertensive nephropathy Comments: Chronic, well controlled. EKG performed, NSR w/ LBBB. She will c/w benazepril 22m daily and amlodipine 127mdaily.  - POCT Urinalysis Dipstick (81002) - Microalbumin / Creatinine Urine Ratio - EKG 12-Lead - CBC - CMP14+EGFR  4. LBBB (left bundle branch block) Comments: New finding on EKG. Previous EKGs were in NSR w/o any abnormalities. She is asymptomatic, agrees to Cardiology referral.  - Ambulatory referral to Cardiology  5. BMI 26.0-26.9,adult Comments: She is encouraged to aim for at least 150 minutes of exercise per week.    Patient was given opportunity to ask questions. Patient verbalized understanding of the plan and was able to repeat key elements of the plan. All questions were answered to their satisfaction.   I, RoMaximino GreenlandMD, have reviewed all documentation for this visit. The documentation on 01/28/22 for the exam, diagnosis, procedures, and orders are all accurate and complete.   THE PATIENT IS ENCOURAGED TO PRACTICE SOCIAL DISTANCING DUE  TO THE COVID-19 PANDEMIC.

## 2022-01-28 NOTE — Patient Instructions (Addendum)
Left Bundle Branch Block  Left bundle branch block (LBBB) is a problem with the way that electrical impulses pass through the heart (electrical conduction abnormality). The heart depends on an electrical pulse to beat normally. The electrical signal for a heartbeat starts in the upper chambers of the heart (atria) and then travels to the two lower chambers (left and rightventricles). An LBBB is a partial or complete block of the pathway that carries the signal to the left ventricle. If you have LBBB, the left side of your heart does not beat normally. LBBB may be a warning sign of heart disease. What are the causes? This condition may be caused by: Heart disease. Disease of the arteries in the heart (arteriosclerosis). Stiffening or weakening of heart muscle (cardiomyopathy). Infection of heart muscle (myocarditis). High blood pressure. In some cases, the cause may not be known. What increases the risk? The following factors may make you more likely to develop this condition: Being female. Being 59 years of age or older. Having heart disease. Having had a heart attack or heart surgery. What are the signs or symptoms? This condition may not cause any symptoms. If you do have symptoms, they may include: Feeling dizzy or light-headed. Fainting. How is this diagnosed? This condition may be diagnosed based on an electrocardiogram (ECG). It is often diagnosed when an ECG is done as part of a routine physical or to help find the cause of fainting spells. You may also have imaging tests to find out more about your condition. These may include: Chest X-rays. Echocardiogram. How is this treated? If you do not have symptoms or any other type of heart disease, you may not need treatment for this condition. However, you may need to see your health care provider more often because LBBB can be a warning sign of future heart problems. You may get treatment for other heart problems or high blood  pressure. If LBBB causes symptoms or other heart problems, you may need to have an electrical device (pacemaker) implanted under the skin of your chest. A pacemaker sends electrical signals to your heart to keep it beating normally. Follow these instructions at home: Lifestyle  Follow instructions from your health care provider about eating or drinking restrictions. Follow a heart-healthy diet and maintain a healthy weight. Work with a dietitian to create an eating plan that is best for you. Do not use any products that contain nicotine or tobacco, such as cigarettes, e-cigarettes, and chewing tobacco. If you need help quitting, ask your health care provider. Activity Get regular exercise as told by your health care provider. Return to your normal activities as told by your health care provider. Ask your health care provider what activities are safe for you. General instructions Take over-the-counter and prescription medicines only as told by your health care provider. Keep all follow-up visits as told by your health care provider. This is important. Contact a health care provider if: You feel light-headed. You faint. Get help right away if: You have chest pain. You have trouble breathing. These symptoms may represent a serious problem that is an emergency. Do not wait to see if the symptoms will go away. Get medical help right away. Call your local emergency services (911 in the [place] Do not drive yourself to the hospital. Summary For the heart to beat normally, an electrical signal must travel to the lower left chamber of the heart. Left bundle branch block (LBBB) is a partial or complete block of the pathway that  carries that signal. This condition may not cause any symptoms. In some cases, a person may feel dizzy or light-headed or may faint. Treatment may not be needed for LBBB if you do not have symptoms or any other type of heart disease. You may need to see your health care provider  more often because LBBB can be a warning sign of future heart problems. This information is not intended to replace advice given to you by your health care provider. Make sure you discuss any questions you have with your health care provider. Document Revised: 11/03/2019 Document Reviewed: 01/12/2019 Elsevier Patient Education  [place] Maintenance, Female Adopting a healthy lifestyle and getting preventive care are important in promoting health and wellness. Ask your health care provider about: The right schedule for you to have regular tests and exams. Things you can do on your own to prevent diseases and keep yourself healthy. What should I know about diet, weight, and exercise? Eat a healthy diet  Eat a diet that includes plenty of vegetables, fruits, low-fat dairy products, and lean protein. Do not eat a lot of foods that are high in solid fats, added sugars, or sodium. Maintain a healthy weight Body mass index (BMI) is used to identify weight problems. It estimates body fat based on height and weight. Your health care provider can help determine your BMI and help you achieve or maintain a healthy weight. Get regular exercise Get regular exercise. This is one of the most important things you can do for your health. Most adults should: Exercise for at least 150 minutes each week. The exercise should increase your heart rate and make you sweat (moderate-intensity exercise). Do strengthening exercises at least twice a week. This is in addition to the moderate-intensity exercise. Spend less time sitting. Even light physical activity can be beneficial. Watch cholesterol and blood lipids Have your blood tested for lipids and cholesterol at 82 years of age, then have this test every 5 years. Have your cholesterol levels checked more often if: Your lipid or cholesterol levels are high. You are older than 82 years of age. You are at high risk for heart disease. What should I  know about cancer screening? Depending on your health history and family history, you may need to have cancer screening at various ages. This may include screening for: Breast cancer. Cervical cancer. Colorectal cancer. Skin cancer. Lung cancer. What should I know about heart disease, diabetes, and high blood pressure? Blood pressure and heart disease High blood pressure causes heart disease and increases the risk of stroke. This is more likely to develop in people who have high blood pressure readings or are overweight. Have your blood pressure checked: Every 3-5 years if you are 17-41 years of age. Every year if you are 54 years old or older. Diabetes Have regular diabetes screenings. This checks your fasting blood sugar level. Have the screening done: Once every three years after age 29 if you are at a normal weight and have a low risk for diabetes. More often and at a younger age if you are overweight or have a high risk for diabetes. What should I know about preventing infection? Hepatitis B If you have a higher risk for hepatitis B, you should be screened for this virus. Talk with your health care provider to find out if you are at risk for hepatitis B infection. Hepatitis C Testing is recommended for: Everyone born from [birth year] through 1966. Anyone with known risk factors for hepatitis  C. Sexually transmitted infections (STIs) Get screened for STIs, including gonorrhea and chlamydia, if: You are sexually active and are younger than 82 years of age. You are older than 82 years of age and your health care provider tells you that you are at risk for this type of infection. Your sexual activity has changed since you were last screened, and you are at increased risk for chlamydia or gonorrhea. Ask your health care provider if you are at risk. Ask your health care provider about whether you are at high risk for HIV. Your health care provider may recommend a prescription medicine to help  prevent HIV infection. If you choose to take medicine to prevent HIV, you should first get tested for HIV. You should then be tested every 3 months for as long as you are taking the medicine. Pregnancy If you are about to stop having your period (premenopausal) and you may become pregnant, seek counseling before you get pregnant. Take 400 to 800 micrograms (mcg) of folic acid every day if you become pregnant. Ask for birth control (contraception) if you want to prevent pregnancy. Osteoporosis and menopause Osteoporosis is a disease in which the bones lose minerals and strength with aging. This can result in bone fractures. If you are 38 years old or older, or if you are at risk for osteoporosis and fractures, ask your health care provider if you should: Be screened for bone loss. Take a calcium or vitamin D supplement to lower your risk of fractures. Be given hormone replacement therapy (HRT) to treat symptoms of menopause. Follow these instructions at home: Alcohol use Do not drink alcohol if: Your health care provider tells you not to drink. You are pregnant, may be pregnant, or are planning to become pregnant. If you drink alcohol: Limit how much you have to: 0-1 drink a day. Know how much alcohol is in your drink. In the [place] one drink equals one 12 oz bottle of beer (355 mL), one 5 oz glass of wine (148 mL), or one 1 oz glass of hard liquor (44 mL). Lifestyle Do not use any products that contain nicotine or tobacco. These products include cigarettes, chewing tobacco, and vaping devices, such as e-cigarettes. If you need help quitting, ask your health care provider. Do not use street drugs. Do not share needles. Ask your health care provider for help if you need support or information about quitting drugs. General instructions Schedule regular health, dental, and eye exams. Stay current with your vaccines. Tell your health care provider if: You often feel depressed. You have ever  been abused or do not feel safe at home. Summary Adopting a healthy lifestyle and getting preventive care are important in promoting health and wellness. Follow your health care provider's instructions about healthy diet, exercising, and getting tested or screened for diseases. Follow your health care provider's instructions on monitoring your cholesterol and blood pressure. This information is not intended to replace advice given to you by your health care provider. Make sure you discuss any questions you have with your health care provider. Document Revised: 12/04/2020 Document Reviewed: 12/04/2020 Elsevier Patient Education  2023 [employer].

## 2022-01-30 ENCOUNTER — Telehealth: Payer: Medicare Other

## 2022-01-30 ENCOUNTER — Ambulatory Visit (INDEPENDENT_AMBULATORY_CARE_PROVIDER_SITE_OTHER): Payer: Medicare Other

## 2022-01-30 DIAGNOSIS — I129 Hypertensive chronic kidney disease with stage 1 through stage 4 chronic kidney disease, or unspecified chronic kidney disease: Secondary | ICD-10-CM

## 2022-01-30 DIAGNOSIS — I447 Left bundle-branch block, unspecified: Secondary | ICD-10-CM

## 2022-01-30 DIAGNOSIS — E1122 Type 2 diabetes mellitus with diabetic chronic kidney disease: Secondary | ICD-10-CM

## 2022-01-30 DIAGNOSIS — E7800 Pure hypercholesterolemia, unspecified: Secondary | ICD-10-CM

## 2022-01-30 LAB — MICROALBUMIN / CREATININE URINE RATIO
Creatinine, Urine: 34.7 mg/dL
Microalb/Creat Ratio: 71 mg/g creat — ABNORMAL HIGH (ref 0–29)
Microalbumin, Urine: 24.6 ug/mL

## 2022-01-30 NOTE — Chronic Care Management (AMB) (Signed)
Chronic Care Management   CCM RN Visit Note  01/30/2022 Name: Jean [name] MRN: [id number] DOB: [birth year]  Subjective: SIMMIE GARIN is a 81 y.o. year old female who is a primary care patient of Glendale Chard, MD. The care management team was consulted for assistance with disease management and care coordination needs.    Engaged with patient by telephone for follow up visit in response to provider referral for case management and/or care coordination services.   Consent to Services:  The patient was given information about Chronic Care Management services, agreed to services, and gave verbal consent prior to initiation of services.  Please see initial visit note for detailed documentation.   Patient agreed to services and verbal consent obtained.   Assessment: Review of patient past medical history, allergies, medications, health status, including review of consultants reports, laboratory and other test data, was performed as part of comprehensive evaluation and provision of chronic care management services.   SDOH (Social Determinants of Health) assessments and interventions performed:  Yes, no acute needs   CCM Care Plan  Allergies  Allergen Reactions   Penicillins Anaphylaxis    Outpatient Encounter Medications as of 01/30/2022  Medication Sig   ACCU-CHEK AVIVA PLUS test strip CHECK BLOOD SUGAR TWICE  DAILY AS DIRECTED   amLODipine (NORVASC) 10 MG tablet TAKE 1 TABLET BY MOUTH  DAILY   aspirin EC 81 MG tablet Take 81 mg by mouth daily.   benazepril (LOTENSIN) 20 MG tablet TAKE 1 TABLET BY MOUTH  DAILY   cholecalciferol (VITAMIN D) 1000 UNITS tablet Take 1,000 Units by mouth daily.   cinacalcet (SENSIPAR) 30 MG tablet Take 30 mg by mouth daily.   rosuvastatin (CRESTOR) 20 MG tablet TAKE 1 TABLET BY MOUTH  DAILY   TRADJENTA 5 MG TABS tablet TAKE ONE TABLET BY MOUTH ONCE DAILY   No facility-administered encounter medications on file as of 01/30/2022.    Patient Active  Problem List   Diagnosis Date Noted   Type 2 diabetes mellitus with stage 2 chronic kidney disease, without long-term current use of insulin ([place]) 08/24/2018   Hypertensive nephropathy 08/24/2018   Gastroesophageal reflux disease without esophagitis 08/24/2018   Pure hypercholesterolemia 08/24/2018    Conditions to be addressed/monitored: DM II, Hypertensive Nephropathy, Pure hypercholesterolemia, LBBB (left bundle branch block)   Care Plan : RN Care Manager Plan of Care  Updates made by Lynne Logan, RN since 01/30/2022 12:00 AM     Problem: No Plan of Care established for management of chronic disease states (DM II, Hypertensive Nephropathy, Pure hypercholesterolemia)   Priority: High     Long-Range Goal: Establishment of Plan of Care for management of chronic disease states (DM II, Hypertensive Nephropathy, Pure hypercholesterolemia)   Start Date: 08/16/2021  Expected End Date: 08/16/2022  Recent Progress: On track  Priority: High  Note:   Current Barriers:  Knowledge Deficits related to plan of care for management of DM II, Hypertensive Nephropathy, Pure hypercholesterolemia   Chronic Disease Management support and education needs related to DM II, Hypertensive Nephropathy, Pure hypercholesterolemia    RNCM Clinical Goal(s):  Patient will verbalize basic understanding of  DM II, Hypertensive Nephropathy, Pure hypercholesterolemia  disease process and self health management plan as evidenced by patient will report having no disease exacerbations related to her chronic disease states as listed above  take all medications exactly as prescribed and will call provider for medication related questions as evidenced by patient will report having no missed doses of  her prescribed medications demonstrate Ongoing health management independence as evidenced by patient will report 100% adherence to her prescribed treatment plan  continue to work with RN Care Manager to address care management  and care coordination needs related to  DM II, Hypertensive Nephropathy, Pure hypercholesterolemia  as evidenced by adherence to CM Team Scheduled appointments demonstrate ongoing self health care management ability   as evidenced by    through collaboration with RN Care manager, provider, and care team.   Interventions: 1:1 collaboration with primary care provider regarding development and update of comprehensive plan of care as evidenced by provider attestation and co-signature Inter-disciplinary care team collaboration (see longitudinal plan of care) Evaluation of current treatment plan related to  self management and patient's adherence to plan as established by provider   Chronic Kidney Disease Interventions:  (Status:  Condition stable.  Not addressed this visit./labs pending) Long Term Goal Assessed the Patient understanding of chronic kidney disease    Evaluation of current treatment plan related to chronic kidney disease self management and patient's adherence to plan as established by provider      Reviewed prescribed diet continue to drink 48-64 oz of water daily  Advised patient, providing education and rationale, to monitor blood pressure daily and record, calling PCP for findings outside established parameters    Discussed complications of poorly controlled blood pressure such as heart disease, stroke, circulatory complications, vision complications, kidney impairment, sexual dysfunction    Provided education on kidney disease progression    Last practice recorded BP readings:  BP Readings from Last 3 Encounters:  11/20/21 122/78  08/14/21 118/66  04/12/21 132/70  Most recent eGFR/CrCl:  Lab Results  Component Value Date   EGFR 52 (L) 11/20/2021    No components found for: CRCL  Diabetes Interventions:  (Status:  Condition stable.  Not addressed this visit./labs pending) Long Term Goal Assessed patient's understanding of A1c goal: <6.5% Provided education to patient about  basic DM disease process Reviewed medications with patient and discussed importance of medication adherence Counseled on importance of regular laboratory monitoring as prescribed Advised patient, providing education and rationale, to check cbg daily before breakfast and record, calling PCP for findings outside established parameters Review of patient status, including review of consultants reports, relevant laboratory and other test results, and medications completed Assessed social determinant of health barriers Lab Results  Component Value Date   HGBA1C 6.9 (H) 11/20/2021   Hyperlipidemia Interventions:  (Status:  Condition stable.  Not addressed this visit./labs pending) Long Term Goal Medication review performed; medication list updated in electronic medical record.  Provider established cholesterol goals reviewed Counseled on importance of regular laboratory monitoring as prescribed Provided HLD educational materials Reviewed importance of limiting foods high in cholesterol Reviewed exercise goals and target of 150 minutes per week Mailed printed educational materials related to Chair Exercises; The Skinny on Fats; My Cholesterol Care Guide  Lipid Panel     Component Value Date/Time   CHOL 192 11/20/2021 0956   TRIG 260 (H) 11/20/2021 0956   HDL 61 11/20/2021 0956   CHOLHDL 3.1 11/20/2021 0956   CHOLHDL 5.3 10/23/2007 0218   VLDL 35 10/23/2007 0218   LDLCALC 88 11/20/2021 0956   LABVLDL 43 (H) 11/20/2021 0956    Right Hand Paresthesia: (Status:  Condition stable.  Not addressed this visit.)  Short Term Goal Evaluation of current treatment plan related to  Carpal Tunnel ,  self-management and patient's adherence to plan as established by provider Determined patient completed  a visit with Dr. Domingo Cocking, Neurologist for evaluation of her right hand paresthesia Discussed patient was diagnosed with bilateral Carpal Tunnel Syndrome for which she will treat with conservative treatment at  this time Educated patient on basic disease process related to Carpal Tunnel Syndrome, instructed to avoid triggers that may worsen condition and to wear the night splints as directed as needed  Determined patient denies having questions at this time, she verbalizes understanding of the diagnosis and treatment options  Discussed plans with patient for ongoing care management follow up and provided patient with direct contact information for care management team   LBBB (left bundle branch block) Interventions:  (Status:  New goal.)  Long Term Goal Evaluation of current treatment plan related to  LBBB (left bundle branch block) , self-management and patient's adherence to plan as established by provider Determined patient has concerns about her recent EKG showing left bundle branch block Discussed PCP recommendations for Cardiology referral to further evaluate, determined the referral was sent to Dr. Nadyne Coombes, patient is aware and advised she was previously established with this Cardiologist  Educated patient on the referral process and what to expect  Educated patient on basic disease process related to left bundle branch block including potential cause and general treatment recommendations  Determined patient remains to be asymptomatic and BP is well controlled at this time  Discussed plans with patient for ongoing care management follow up and provided patient with direct contact information for care management team   Hypertension Interventions:  (Status:  New goal.) Long Term Goal Last practice recorded BP readings:  BP Readings from Last 3 Encounters:  01/28/22 124/80  01/09/22 122/62  11/20/21 122/78  Most recent eGFR/CrCl:  Lab Results  Component Value Date   EGFR 52 (L) 11/20/2021    No components found for: "CRCL" Evaluation of current treatment plan related to hypertension self management and patient's adherence to plan as established by provider Provided education to patient re:  stroke prevention, s/s of heart attack and stroke Reviewed medications with patient and discussed importance of compliance Counseled on the importance of exercise goals with target of 150 minutes per week Advised patient, providing education and rationale, to monitor blood pressure daily and record, calling PCP for findings outside established parameters Provided education on prescribed diet low Sodium   Patient Goals/Self-Care Activities: Take all medications as prescribed Attend all scheduled provider appointments Call pharmacy for medication refills 3-7 days in advance of running out of medications Perform all self care activities independently  Perform IADL's (shopping, preparing meals, housekeeping, managing finances) independently Call provider office for new concerns or questions  schedule appointment with eye doctor check blood sugar at prescribed times: once daily check feet daily for cuts, sores or redness drink 6 to 8 glasses of water each day fill half of plate with vegetables manage portion size check blood pressure 3 times per week take blood pressure log to all doctor appointments call doctor for signs and symptoms of high blood pressure take medications for blood pressure exactly as prescribed adhere to prescribed diet: low trans/saturated fats   Follow Up Plan:  Telephone follow up appointment with care management team member scheduled for:  03/27/22     Barb Merino, RN, BSN, CCM Care Management Coordinator [facility]  Direct Phone: [phone]

## 2022-01-30 NOTE — Patient Instructions (Signed)
Visit Information  Thank you for taking time to visit with me today. Please don't hesitate to contact me if I can be of assistance to you before our next scheduled telephone appointment.  Following are the goals we discussed today:  Take all medications as prescribed Attend all scheduled provider appointments Call pharmacy for medication refills 3-7 days in advance of running out of medications Perform all self care activities independently  Perform IADL's (shopping, preparing meals, housekeeping, managing finances) independently Call provider office for new concerns or questions  schedule appointment with eye doctor check blood sugar at prescribed times: once daily check feet daily for cuts, sores or redness drink 6 to 8 glasses of water each day fill half of plate with vegetables manage portion size check blood pressure 3 times per week take blood pressure log to all doctor appointments call doctor for signs and symptoms of high blood pressure take medications for blood pressure exactly as prescribed adhere to prescribed diet: low trans/saturated fats  Our next appointment is by telephone on 03/27/22 at 09:30 AM   Please call the care guide team at [phone] if you need to cancel or reschedule your appointment.   If you are experiencing a Mental Health or Behavioral Health Crisis or need someone to talk to, please call 1-800-273-TALK (toll free, 24 hour hotline)   The patient verbalized understanding of instructions, educational materials, and care plan provided today and agreed to receive a mailed copy of patient instructions, educational materials, and care plan.   [name], RN, BSN, CCM Care Management Coordinator [facility]  Direct Phone: [phone]

## 2022-01-31 LAB — PROTEIN ELECTROPHORESIS, SERUM
A/G Ratio: 1 (ref 0.7–1.7)
Albumin ELP: 4.1 g/dL (ref 2.9–4.4)
Alpha 1: 0.3 g/dL (ref 0.0–0.4)
Alpha 2: 1 g/dL (ref 0.4–1.0)
Beta: 1.4 g/dL — ABNORMAL HIGH (ref 0.7–1.3)
Gamma Globulin: 1.3 g/dL (ref 0.4–1.8)
Globulin, Total: 4 g/dL — ABNORMAL HIGH (ref 2.2–3.9)

## 2022-01-31 LAB — CBC
Hematocrit: 39.4 % (ref 34.0–46.6)
Hemoglobin: 13 g/dL (ref 11.1–15.9)
MCH: 28.4 pg (ref 26.6–33.0)
MCHC: 33 g/dL (ref 31.5–35.7)
MCV: 86 fL (ref 79–97)
Platelets: 271 10*3/uL (ref 150–450)
RBC: 4.58 x10E6/uL (ref 3.77–5.28)
RDW: 13.8 % (ref 11.7–15.4)
WBC: 8.3 10*3/uL (ref 3.4–10.8)

## 2022-01-31 LAB — PTH, INTACT AND CALCIUM: PTH: 49 pg/mL (ref 15–65)

## 2022-01-31 LAB — CMP14+EGFR
ALT: 15 IU/L (ref 0–32)
AST: 18 IU/L (ref 0–40)
Albumin/Globulin Ratio: 1.5 (ref 1.2–2.2)
Albumin: 4.8 g/dL — ABNORMAL HIGH (ref 3.6–4.6)
Alkaline Phosphatase: 124 IU/L — ABNORMAL HIGH (ref 44–121)
BUN/Creatinine Ratio: 10 — ABNORMAL LOW (ref 12–28)
BUN: 12 mg/dL (ref 8–27)
Bilirubin Total: 0.5 mg/dL (ref 0.0–1.2)
CO2: 20 mmol/L (ref 20–29)
Calcium: 10.1 mg/dL (ref 8.7–10.3)
Chloride: 101 mmol/L (ref 96–106)
Creatinine, Ser: 1.17 mg/dL — ABNORMAL HIGH (ref 0.57–1.00)
Globulin, Total: 3.3 g/dL (ref 1.5–4.5)
Glucose: 80 mg/dL (ref 70–99)
Potassium: 4.9 mmol/L (ref 3.5–5.2)
Sodium: 137 mmol/L (ref 134–144)
Total Protein: 8.1 g/dL (ref 6.0–8.5)
eGFR: 47 mL/min/{1.73_m2} — ABNORMAL LOW (ref >59)

## 2022-01-31 LAB — HEMOGLOBIN A1C
Est. average glucose Bld gHb Est-mCnc: 146 mg/dL
Hgb A1c MFr Bld: 6.7 % — ABNORMAL HIGH (ref 4.8–5.6)

## 2022-01-31 LAB — PHOSPHORUS: Phosphorus: 3.3 mg/dL (ref 3.0–4.3)

## 2022-02-25 DIAGNOSIS — N1831 Chronic kidney disease, stage 3a: Secondary | ICD-10-CM

## 2022-02-25 DIAGNOSIS — E1122 Type 2 diabetes mellitus with diabetic chronic kidney disease: Secondary | ICD-10-CM | POA: Diagnosis not present

## 2022-02-25 DIAGNOSIS — E7800 Pure hypercholesterolemia, unspecified: Secondary | ICD-10-CM

## 2022-02-25 DIAGNOSIS — I129 Hypertensive chronic kidney disease with stage 1 through stage 4 chronic kidney disease, or unspecified chronic kidney disease: Secondary | ICD-10-CM | POA: Diagnosis not present

## 2022-03-18 NOTE — Progress Notes (Signed)
This encounter was created in error - please disregard.

## 2022-03-27 ENCOUNTER — Ambulatory Visit: Payer: Self-pay

## 2022-03-27 NOTE — Patient Instructions (Signed)
Visit Information  Thank you for taking time to visit with me today. Please don't hesitate to contact me if I can be of assistance to you.   Following are the goals we discussed today:   Goals Addressed     Patient Stated     I did not receive an appointment to see the Cardiologist (pt-stated)        Care Coordination Interventions: Evaluation of current treatment plan related to LBBB (left branch bundle) and patient's adherence to plan as established by provider Determined patient did not receive a call from [facility], noted referral was sent by PCP on 01/28/22 via EP[facility] Collaborated with [place] from [place] [facility] regarding recent referral, noted the referral was not received by the referral coordinator Determined the referral coordinator for this office will contact the patient today to schedule a new patient appointment    Our next appointment is by telephone on 04/10/22 at 09:45 AM  Please call the care guide team at [phone] if you need to cancel or reschedule your appointment.   If you are experiencing a Mental Health or Behavioral Health Crisis or need someone to talk to, please call 1-800-273-TALK (toll free, 24 hour hotline)  The patient verbalized understanding of instructions, educational materials, and care plan provided today and agreed to receive a mailed copy of patient instructions, educational materials, and care plan.   [name], RN, BSN, CCM Care Management Coordinator [facility] Management Direct Phone: [phone]

## 2022-03-27 NOTE — Patient Outreach (Signed)
  Care Coordination   Follow Up Visit Note   03/27/2022 Name: Jean [name] MRN: [id number] DOB: 1941/02/29  Jean [name] is a 81 y.o. year old female who sees [name], MD for primary care. I spoke with  Carlena Sax by phone today.  What matters to the patients health and wellness today?  Patient needs assistance following up with Cardiology for a new patient appointment.     Goals Addressed     Patient Stated     I did not receive an appointment to see the Cardiologist (pt-stated)        Care Coordination Interventions: Evaluation of current treatment plan related to LBBB (left branch bundle) and patient's adherence to plan as established by provider Determined patient did not receive a call from [facility], noted referral was sent by PCP on 01/28/22 via EPIC Collaborated with [place] from [place] [facility] regarding recent referral, noted the referral was not received by the referral coordinator Determined the referral coordinator for this office will contact the patient today to schedule a new patient appointment    SDOH assessments and interventions completed:  No     Care Coordination Interventions Activated:  Yes  Care Coordination Interventions:  Yes, provided   Follow up plan: Follow up call scheduled for 04/10/22 @09 :45 AM    Encounter Outcome:  Pt. Visit Completed

## 2022-04-02 DIAGNOSIS — D631 Anemia in chronic kidney disease: Secondary | ICD-10-CM | POA: Diagnosis not present

## 2022-04-02 DIAGNOSIS — N189 Chronic kidney disease, unspecified: Secondary | ICD-10-CM | POA: Diagnosis not present

## 2022-04-02 DIAGNOSIS — N1831 Chronic kidney disease, stage 3a: Secondary | ICD-10-CM | POA: Diagnosis not present

## 2022-04-02 DIAGNOSIS — I129 Hypertensive chronic kidney disease with stage 1 through stage 4 chronic kidney disease, or unspecified chronic kidney disease: Secondary | ICD-10-CM | POA: Diagnosis not present

## 2022-04-02 DIAGNOSIS — E213 Hyperparathyroidism, unspecified: Secondary | ICD-10-CM | POA: Diagnosis not present

## 2022-04-02 DIAGNOSIS — N390 Urinary tract infection, site not specified: Secondary | ICD-10-CM | POA: Diagnosis not present

## 2022-04-10 ENCOUNTER — Ambulatory Visit: Payer: Medicare Other | Admitting: Internal Medicine

## 2022-04-10 ENCOUNTER — Encounter: Payer: Self-pay | Admitting: Internal Medicine

## 2022-04-10 VITALS — BP 136/77 | HR 106 | Temp 98.0°F | Resp 16 | Ht 63.0 in | Wt 150.0 lb

## 2022-04-10 DIAGNOSIS — I447 Left bundle-branch block, unspecified: Secondary | ICD-10-CM | POA: Insufficient documentation

## 2022-04-10 DIAGNOSIS — R000 Tachycardia, unspecified: Secondary | ICD-10-CM | POA: Insufficient documentation

## 2022-04-10 DIAGNOSIS — I10 Essential (primary) hypertension: Secondary | ICD-10-CM

## 2022-04-10 NOTE — Progress Notes (Signed)
Primary Physician/Referring:  Glendale Chard, MD  Patient ID: Jean [name], female    DOB: 07/09/1941, 81 y.o.   MRN: [id number]  Chief Complaint  Patient presents with   LBBB   New Patient (Initial Visit)   HPI:    Jean [name]  is a 81 y.o. female with HTN, HLD, and DM2 who is here to establish care with cardiology. Her PCP noted a LBBB on her EKG recently. Today she is in sinus tachycardia with possible incomplete LBBB. Patient has not had an echo or stress test in almost ten years. Given her past medical history, she is high risk for cardiovascular disease. Patient agreeable to proceeding with testing. She denies previous cardiac history however she did have mildly abnormal ABIs 9 years ago. She denies chest pain, SOB, palpitations, diaphoresis, syncope.  Past Medical History:  Diagnosis Date   Blood transfusion without reported diagnosis    No reaction    Diabetes mellitus without complication (HCC)    Hypertension    Past Surgical History:  Procedure Laterality Date   ABDOMINAL HYSTERECTOMY  1966   BACK SURGERY  1978   BREAST EXCISIONAL BIOPSY     BREAST SURGERY Bilateral 1976   lumpectomy   NEPHRECTOMY Right 1982   WRIST SURGERY Right 2009   Family History  Problem Relation Age of Onset   Healthy Mother    Healthy Father    Breast cancer Sister    Cancer Sister    Breast cancer Sister    Cancer Sister    Cancer Sister    Cancer Sister    Cancer Sister    Cancer Brother    Cancer Brother    Cancer Maternal Aunt     Social History   Tobacco Use   Smoking status: Never   Smokeless tobacco: Never  Substance Use Topics   Alcohol use: No   Marital Status: Widowed  ROS  Review of Systems  Cardiovascular:  Positive for claudication and dyspnea on exertion.  All other systems reviewed and are negative. Objective  Blood pressure 136/77, pulse (!) 106, temperature 98 F (36.7 C), resp. rate 16, height _0  (1.6 m), weight 150 lb (68 kg), SpO2 97 %. Body  mass index is 26.57 kg/m.     04/10/2022   10:31 AM 01/28/2022   10:03 AM 01/09/2022   12:21 PM  Vitals with BMI  Height _1  5' 3.6"   Weight 150 lbs 151 lbs 10 oz   BMI 72.09 47.09   Systolic 628 366 294  Diastolic 77 80 62  Pulse 765 91 95     Physical Exam Vitals and nursing note reviewed.  Constitutional:      Appearance: Normal appearance.  HENT:     Head: Normocephalic and atraumatic.  Cardiovascular:     Rate and Rhythm: Regular rhythm. Tachycardia present.     Pulses: Normal pulses.     Heart sounds: Normal heart sounds. No murmur heard.    No gallop.  Pulmonary:     Effort: Pulmonary effort is normal.     Breath sounds: Normal breath sounds.  Abdominal:     General: Bowel sounds are normal.     Palpations: Abdomen is soft.  Musculoskeletal:     Right lower leg: No edema.     Left lower leg: No edema.  Skin:    General: Skin is warm and dry.  Neurological:     Mental Status: She is alert.   Medications and  allergies   Allergies  Allergen Reactions   Penicillins Anaphylaxis     Medication list after today's encounter   Current Outpatient Medications:    amLODipine (NORVASC) 10 MG tablet, TAKE 1 TABLET BY MOUTH  DAILY, Disp: 90 tablet, Rfl: 3   aspirin EC 81 MG tablet, Take 81 mg by mouth daily., Disp: , Rfl:    benazepril (LOTENSIN) 20 MG tablet, TAKE 1 TABLET BY MOUTH  DAILY, Disp: 90 tablet, Rfl: 3   cholecalciferol (VITAMIN D) 1000 UNITS tablet, Take 1,000 Units by mouth daily., Disp: , Rfl:    cinacalcet (SENSIPAR) 30 MG tablet, Take 30 mg by mouth daily., Disp: , Rfl:    rosuvastatin (CRESTOR) 20 MG tablet, TAKE 1 TABLET BY MOUTH  DAILY, Disp: 90 tablet, Rfl: 3   TRADJENTA 5 MG TABS tablet, TAKE ONE TABLET BY MOUTH ONCE DAILY, Disp: 90 tablet, Rfl: 5   ACCU-CHEK AVIVA PLUS test strip, CHECK BLOOD SUGAR TWICE  DAILY AS DIRECTED, Disp: 200 strip, Rfl: 3  Laboratory examination:   Lab Results  Component Value Date   NA 137 01/28/2022   K 4.9  01/28/2022   CO2 20 01/28/2022   GLUCOSE 80 01/28/2022   BUN 12 01/28/2022   CREATININE 1.17 (H) 01/28/2022   CALCIUM 10.1 01/28/2022   EGFR 47 (L) 01/28/2022   GFRNONAA 54 (L) 08/23/2020       Latest Ref Rng & Units 01/28/2022   11:19 AM 11/20/2021    9:56 AM 08/14/2021   11:16 AM  CMP  Glucose 70 - 99 mg/dL 80  83  81   BUN 8 - 27 mg/dL _0 Creatinine 0.57 - 1.00 mg/dL 1.17  1.08  1.11   Sodium 134 - 144 mmol/L 137  140  139   Potassium 3.5 - 5.2 mmol/L 4.9  4.5  4.6   Chloride 96 - 106 mmol/L 101  103  103   CO2 20 - 29 mmol/L _1 Calcium 8.7 - 10.3 mg/dL 10.1  10.0  10.2   Total Protein 6.0 - 8.5 g/dL 8.1   8.2   Total Bilirubin 0.0 - 1.2 mg/dL 0.5   0.5   Alkaline Phos 44 - 121 IU/L 124   129   AST 0 - 40 IU/L 18   22   ALT 0 - 32 IU/L 15   17       Latest Ref Rng & Units 01/28/2022   11:19 AM 12/14/2020   12:06 PM 12/16/2019   11:58 AM  CBC  WBC 3.4 - 10.8 x10E3/uL 8.3  6.2  8.7   Hemoglobin 11.1 - 15.9 g/dL 13.0  12.8  13.4   Hematocrit 34.0 - 46.6 % 39.4  38.4  41.4   Platelets 150 - 450 x10E3/uL 271  261  267     Lipid Panel Recent Labs    11/20/21 0956  CHOL 192  TRIG 260*  LDLCALC 88  HDL 61  CHOLHDL 3.1    HEMOGLOBIN A1C Lab Results  Component Value Date   HGBA1C 6.7 (H) 01/28/2022   MPG 175 10/22/2007   TSH Recent Labs    04/12/21 1218  TSH 1.490    External labs:     Radiology:    Cardiac Studies:   No results found for this or any previous visit from the past 1095 days.     No results found for this or any previous visit from the  past 1095 days.     EKG:   04/10/22 Sinus Tachycardia iLBBB -Left atrial enlargement. -Poor R-wave progression -Nonspecific T-abnormality.    Assessment     ICD-10-CM   1. LBBB (left bundle branch block)  I44.7 EKG 12-Lead    PCV ECHOCARDIOGRAM COMPLETE    PCV MYOCARDIAL PERFUSION WO LEXISCAN    2. Essential hypertension  I10 PCV ECHOCARDIOGRAM COMPLETE    PCV MYOCARDIAL  PERFUSION WO LEXISCAN    3. Tachycardia  R00.0 PCV ECHOCARDIOGRAM COMPLETE    PCV MYOCARDIAL PERFUSION WO LEXISCAN       Orders Placed This Encounter  Procedures   PCV MYOCARDIAL PERFUSION WO LEXISCAN    Standing Status:   Future    Standing Expiration Date:   06/10/2022   EKG 12-Lead   PCV ECHOCARDIOGRAM COMPLETE    Standing Status:   Future    Standing Expiration Date:   04/11/2023    No orders of the defined types were placed in this encounter.   There are no discontinued medications.   Recommendations:   Jean [name] is a 81 y.o.  with HTN, HLD, and LBBB found on EKG at her PCP office  Continue current cardiac medications. Encourage low-sodium diet, less than 2000 mg daily. Schedule imaging tests in office - echo and stress test. Follow-up in 1-2 months or sooner if needed.     Floydene Flock, DO, [facility]  04/10/2022, 11:15 AM Office: [phone] Pager: [phone]

## 2022-04-16 ENCOUNTER — Ambulatory Visit: Payer: Self-pay

## 2022-04-16 NOTE — Patient Outreach (Signed)
  Care Coordination   04/16/2022 Name: Jean [name] MRN: [id number] DOB: Nov 28, 1940   Care Coordination Outreach Attempts:  An unsuccessful telephone outreach was attempted for a scheduled appointment today.  Follow Up Plan:  Additional outreach attempts will be made to offer the patient care coordination information and services.   Encounter Outcome:  Pt. Request to Call Back  Care Coordination Interventions Activated:  No   Care Coordination Interventions:  No, not indicated    Barb Merino, RN, BSN, CCM Care Management Coordinator [facility] [facility]  Direct Phone: [phone]

## 2022-04-23 ENCOUNTER — Ambulatory Visit: Payer: Self-pay

## 2022-04-23 NOTE — Patient Instructions (Signed)
Visit Information  Thank you for taking time to visit with me today. Please don't hesitate to contact me if I can be of assistance to you.   Following are the goals we discussed today:   Goals Addressed               This Visit's Progress     Patient Stated     I did not receive an appointment to see the Cardiologist (pt-stated)        Care Coordination Interventions: Evaluation of current treatment plan related to LBBB (left branch bundle) and patient's adherence to plan as established by provider Determined patient completed her initial visit with Cardiologist, Dr. Shellia Carwin Review of patient status, including review of consultant's reports, relevant laboratory and other test results, and medications completed Assessed for need of BP cuff in order to self monitor BP at home, patient will use her U-card in October to purchase a new BP cuff Reviewed scheduled/upcoming provider appointments including: Echo, Myoview and next Cardiology f/u Mailed printed educational materials related to Hypertension management and How to accurately measure BP at home           Our next appointment is by telephone on 05/24/22 at 11:30 AM  Please call the care guide team at [phone] if you need to cancel or reschedule your appointment.   If you are experiencing a Mental Health or [place] or need someone to talk to, please call 1-800-273-TALK (toll free, 24 hour hotline)  The patient verbalized understanding of instructions, educational materials, and care plan provided today and agreed to receive a mailed copy of patient instructions, educational materials, and care plan.   Barb Merino, RN, BSN, CCM Care Management Coordinator [facility]  Direct Phone: [phone]

## 2022-04-23 NOTE — Patient Outreach (Signed)
  Care Coordination   Follow Up Visit Note   04/23/2022 Name: Jean [name] MRN: [id number] DOB: May 30, 1941  Jean [name] is a 81 y.o. year old female who sees Glendale Chard, MD for primary care. I spoke with  Richarda Blade by phone today.  What matters to the patients health and wellness today?  Patient will undergo cardia imaging per her Cardiologist.     Goals Addressed               This Visit's Progress     Patient Stated     I did not receive an appointment to see the Cardiologist (pt-stated)        Care Coordination Interventions: Evaluation of current treatment plan related to LBBB (left branch bundle) and patient's adherence to plan as established by provider Determined patient completed her initial visit with Cardiologist, Dr. Shellia Carwin Review of patient status, including review of consultant's reports, relevant laboratory and other test results, and medications completed Assessed for need of BP cuff in order to self monitor BP at home, patient will use her U-card in October to purchase a new BP cuff Reviewed scheduled/upcoming provider appointments including: Echo, Myoview and next Cardiology f/u Mailed printed educational materials related to Hypertension management and How to accurately measure BP at home           SDOH assessments and interventions completed:  No     Care Coordination Interventions Activated:  Yes  Care Coordination Interventions:  Yes, provided   Follow up plan: Follow up call scheduled for 05/24/22 @11 :30 AM     Encounter Outcome:  Pt. Visit Completed

## 2022-04-23 NOTE — Patient Outreach (Signed)
  Care Coordination   04/23/2022 Name: EVIE CROSTON MRN: [id number] DOB: [birth year]   Care Coordination Outreach Attempts:  An unsuccessful telephone outreach was attempted for a scheduled appointment today.  Follow Up Plan:  Additional outreach attempts will be made to offer the patient care coordination information and services.   Encounter Outcome:  Pt. Request to Call Back  Care Coordination Interventions Activated:  No   Care Coordination Interventions:  No, not indicated    Barb Merino, RN, BSN, CCM Care Management Coordinator [facility]  Direct Phone: [phone]

## 2022-04-29 ENCOUNTER — Ambulatory Visit: Payer: Medicare Other

## 2022-04-29 DIAGNOSIS — R000 Tachycardia, unspecified: Secondary | ICD-10-CM | POA: Diagnosis not present

## 2022-04-29 DIAGNOSIS — I447 Left bundle-branch block, unspecified: Secondary | ICD-10-CM

## 2022-04-29 DIAGNOSIS — I10 Essential (primary) hypertension: Secondary | ICD-10-CM

## 2022-05-03 ENCOUNTER — Ambulatory Visit: Payer: Medicare Other

## 2022-05-03 DIAGNOSIS — I447 Left bundle-branch block, unspecified: Secondary | ICD-10-CM

## 2022-05-03 DIAGNOSIS — I10 Essential (primary) hypertension: Secondary | ICD-10-CM

## 2022-05-03 DIAGNOSIS — I480 Paroxysmal atrial fibrillation: Secondary | ICD-10-CM | POA: Diagnosis not present

## 2022-05-03 DIAGNOSIS — R000 Tachycardia, unspecified: Secondary | ICD-10-CM

## 2022-05-06 ENCOUNTER — Telehealth: Payer: Self-pay | Admitting: Pharmacy Technician

## 2022-05-06 DIAGNOSIS — Z596 Low income: Secondary | ICD-10-CM

## 2022-05-06 NOTE — Progress Notes (Signed)
[facility])                                            [place] Team    05/06/2022  [name] 04-May-1941 [id number]                                      Medication Assistance Referral-FOR 2024 RE ENROLLMENT  Referral From: [place]  Medication/Company: [name] / [employer] Patient application portion:  Mailed Provider application portion: Faxed  to Dr. Glendale Chard Provider address/fax verified via: Office website    Jean [name] [name], [place]  [phone]

## 2022-05-22 ENCOUNTER — Encounter: Payer: Self-pay | Admitting: Internal Medicine

## 2022-05-22 ENCOUNTER — Ambulatory Visit: Payer: Medicare Other | Admitting: Internal Medicine

## 2022-05-22 VITALS — BP 120/73 | HR 96 | Ht 63.0 in | Wt 149.2 lb

## 2022-05-22 DIAGNOSIS — R000 Tachycardia, unspecified: Secondary | ICD-10-CM

## 2022-05-22 DIAGNOSIS — I447 Left bundle-branch block, unspecified: Secondary | ICD-10-CM

## 2022-05-22 DIAGNOSIS — I10 Essential (primary) hypertension: Secondary | ICD-10-CM | POA: Diagnosis not present

## 2022-05-22 NOTE — Progress Notes (Signed)
Primary Physician/Referring:  Glendale Chard, MD  Patient ID: Jean [name], female    DOB: 02/14/1941, 81 y.o.   MRN: [id number]  Chief Complaint  Patient presents with   LBBB   Follow-up   Results   HPI:    Jean [name]  is a 81 y.o. female with HTN, HLD, and DM2 who is here follow-up visit. Patient has been doing very well since the last time she was here. Her stress test and echocardiogram were both normal. Patient agreeable to following with cardiology PRN.  She denies chest pain, SOB, palpitations, diaphoresis, syncope.  Past Medical History:  Diagnosis Date   Blood transfusion without reported diagnosis    No reaction    Diabetes mellitus without complication (HCC)    Hypertension    Past Surgical History:  Procedure Laterality Date   ABDOMINAL HYSTERECTOMY  1966   BACK SURGERY  1978   BREAST EXCISIONAL BIOPSY     BREAST SURGERY Bilateral 1976   lumpectomy   NEPHRECTOMY Right 1982   WRIST SURGERY Right 2009   Family History  Problem Relation Age of Onset   Healthy Mother    Healthy Father    Breast cancer Sister    Cancer Sister    Breast cancer Sister    Cancer Sister    Cancer Sister    Cancer Sister    Cancer Sister    Cancer Brother    Cancer Brother    Cancer Maternal Aunt     Social History   Tobacco Use   Smoking status: Never   Smokeless tobacco: Never  Substance Use Topics   Alcohol use: No   Marital Status: Widowed  ROS  Review of Systems  Cardiovascular:  Negative for claudication and dyspnea on exertion.  All other systems reviewed and are negative.  Objective  Blood pressure 120/73, pulse 96, height 5' 3"  (1.6 m), weight 149 lb 3.2 oz (67.7 kg), SpO2 98 %. Body mass index is 26.43 kg/m.     05/22/2022   10:40 AM 04/10/2022   10:31 AM 01/28/2022   10:03 AM  Vitals with BMI  Height 5' 3"  5' 3"  5' 3.6"  Weight 149 lbs 3 oz 150 lbs 151 lbs 10 oz  BMI 26.44 50.09 38.18  Systolic 299 371 696  Diastolic 73 77 80  Pulse 96 106  91     Physical Exam Vitals and nursing note reviewed.  Constitutional:      Appearance: Normal appearance.  HENT:     Head: Normocephalic and atraumatic.  Cardiovascular:     Rate and Rhythm: Regular rhythm. Tachycardia present.     Pulses: Normal pulses.     Heart sounds: Normal heart sounds. No murmur heard.    No gallop.  Pulmonary:     Effort: Pulmonary effort is normal.     Breath sounds: Normal breath sounds.  Abdominal:     General: Bowel sounds are normal.     Palpations: Abdomen is soft.  Musculoskeletal:     Right lower leg: No edema.     Left lower leg: No edema.  Skin:    General: Skin is warm and dry.  Neurological:     Mental Status: She is alert.    Medications and allergies   Allergies  Allergen Reactions   Penicillins Anaphylaxis     Medication list after today's encounter   Current Outpatient Medications:    ACCU-CHEK AVIVA PLUS test strip, CHECK BLOOD SUGAR TWICE  DAILY AS  DIRECTED, Disp: 200 strip, Rfl: 3   amLODipine (NORVASC) 10 MG tablet, TAKE 1 TABLET BY MOUTH  DAILY, Disp: 90 tablet, Rfl: 3   aspirin EC 81 MG tablet, Take 81 mg by mouth daily., Disp: , Rfl:    benazepril (LOTENSIN) 20 MG tablet, TAKE 1 TABLET BY MOUTH  DAILY, Disp: 90 tablet, Rfl: 3   cholecalciferol (VITAMIN D) 1000 UNITS tablet, Take 1,000 Units by mouth daily., Disp: , Rfl:    cinacalcet (SENSIPAR) 30 MG tablet, Take 30 mg by mouth daily., Disp: , Rfl:    rosuvastatin (CRESTOR) 20 MG tablet, TAKE 1 TABLET BY MOUTH  DAILY, Disp: 90 tablet, Rfl: 3   TRADJENTA 5 MG TABS tablet, TAKE ONE TABLET BY MOUTH ONCE DAILY, Disp: 90 tablet, Rfl: 5  Laboratory examination:   Lab Results  Component Value Date   NA 137 01/28/2022   K 4.9 01/28/2022   CO2 20 01/28/2022   GLUCOSE 80 01/28/2022   BUN 12 01/28/2022   CREATININE 1.17 (H) 01/28/2022   CALCIUM 10.1 01/28/2022   EGFR 47 (L) 01/28/2022   GFRNONAA 54 (L) 08/23/2020       Latest Ref Rng & Units 01/28/2022   11:19 AM  11/20/2021    9:56 AM 08/14/2021   11:16 AM  CMP  Glucose 70 - 99 mg/dL 80  83  81   BUN 8 - 27 mg/dL 12  14  13    Creatinine 0.57 - 1.00 mg/dL 1.17  1.08  1.11   Sodium 134 - 144 mmol/L 137  140  139   Potassium 3.5 - 5.2 mmol/L 4.9  4.5  4.6   Chloride 96 - 106 mmol/L 101  103  103   CO2 20 - 29 mmol/L 20  19  21    Calcium 8.7 - 10.3 mg/dL 10.1  10.0  10.2   Total Protein 6.0 - 8.5 g/dL 8.1   8.2   Total Bilirubin 0.0 - 1.2 mg/dL 0.5   0.5   Alkaline Phos 44 - 121 IU/L 124   129   AST 0 - 40 IU/L 18   22   ALT 0 - 32 IU/L 15   17       Latest Ref Rng & Units 01/28/2022   11:19 AM 12/14/2020   12:06 PM 12/16/2019   11:58 AM  CBC  WBC 3.4 - 10.8 x10E3/uL 8.3  6.2  8.7   Hemoglobin 11.1 - 15.9 g/dL 13.0  12.8  13.4   Hematocrit 34.0 - 46.6 % 39.4  38.4  41.4   Platelets 150 - 450 x10E3/uL 271  261  267     Lipid Panel Recent Labs    11/20/21 0956  CHOL 192  TRIG 260*  LDLCALC 88  HDL 61  CHOLHDL 3.1     HEMOGLOBIN A1C Lab Results  Component Value Date   HGBA1C 6.7 (H) 01/28/2022   MPG 175 10/22/2007   TSH No results for input(s): "TSH" in the last 8760 hours.   External labs:     Radiology:    Cardiac Studies:   No results found for this or any previous visit from the past 1095 days.     No results found for this or any previous visit from the past 1095 days.     EKG:   04/10/22 Sinus Tachycardia iLBBB -Left atrial enlargement. -Poor R-wave progression -Nonspecific T-abnormality.    Assessment     ICD-10-CM   1. LBBB (left bundle branch block)  I44.7     2. Essential hypertension  I10     3. Tachycardia  R00.0        No orders of the defined types were placed in this encounter.   No orders of the defined types were placed in this encounter.   There are no discontinued medications.   Recommendations:   Jean [name] is a 81 y.o.  with HTN, HLD, and LBBB on EKG   LBBB (left bundle branch block) Echo and stress test  normal  Essential hypertension Continue current cardiac medications. Encourage low-sodium diet, less than 2000 mg daily.  Tachycardia Patient no longer experiencing tachycardia   Follow-up in PRN     Floydene Flock, DO, [facility]  05/22/2022, 11:37 AM Office: [phone] Pager: [phone]

## 2022-05-29 ENCOUNTER — Ambulatory Visit: Payer: Self-pay

## 2022-05-29 NOTE — Patient Outreach (Signed)
  Care Coordination   05/29/2022 Name: Jean [name] MRN: [id number] DOB: 06/20/1941   Care Coordination Outreach Attempts:  An unsuccessful telephone outreach was attempted for a scheduled appointment today.  Follow Up Plan:  Additional outreach attempts will be made to offer the patient care coordination information and services.   Encounter Outcome:  No Answer  Care Coordination Interventions Activated:  No   Care Coordination Interventions:  No, not indicated    Barb Merino, RN, BSN, CCM Care Management Coordinator [facility] Direct Phone: [phone]

## 2022-06-13 DIAGNOSIS — Z79899 Other long term (current) drug therapy: Secondary | ICD-10-CM | POA: Diagnosis not present

## 2022-06-13 DIAGNOSIS — E1122 Type 2 diabetes mellitus with diabetic chronic kidney disease: Secondary | ICD-10-CM | POA: Diagnosis not present

## 2022-06-13 DIAGNOSIS — M8580 Other specified disorders of bone density and structure, unspecified site: Secondary | ICD-10-CM | POA: Diagnosis not present

## 2022-06-13 DIAGNOSIS — E559 Vitamin D deficiency, unspecified: Secondary | ICD-10-CM | POA: Diagnosis not present

## 2022-06-13 DIAGNOSIS — N1831 Chronic kidney disease, stage 3a: Secondary | ICD-10-CM | POA: Diagnosis not present

## 2022-06-13 DIAGNOSIS — E1169 Type 2 diabetes mellitus with other specified complication: Secondary | ICD-10-CM | POA: Diagnosis not present

## 2022-06-13 DIAGNOSIS — Z1159 Encounter for screening for other viral diseases: Secondary | ICD-10-CM | POA: Diagnosis not present

## 2022-06-13 DIAGNOSIS — I447 Left bundle-branch block, unspecified: Secondary | ICD-10-CM | POA: Diagnosis not present

## 2022-06-13 DIAGNOSIS — Z23 Encounter for immunization: Secondary | ICD-10-CM | POA: Diagnosis not present

## 2022-06-13 DIAGNOSIS — I509 Heart failure, unspecified: Secondary | ICD-10-CM | POA: Diagnosis not present

## 2022-06-13 DIAGNOSIS — I130 Hypertensive heart and chronic kidney disease with heart failure and stage 1 through stage 4 chronic kidney disease, or unspecified chronic kidney disease: Secondary | ICD-10-CM | POA: Diagnosis not present

## 2022-06-13 DIAGNOSIS — E785 Hyperlipidemia, unspecified: Secondary | ICD-10-CM | POA: Diagnosis not present

## 2022-06-25 DIAGNOSIS — Z0001 Encounter for general adult medical examination with abnormal findings: Secondary | ICD-10-CM | POA: Diagnosis not present

## 2022-06-25 DIAGNOSIS — E1169 Type 2 diabetes mellitus with other specified complication: Secondary | ICD-10-CM | POA: Diagnosis not present

## 2022-06-25 DIAGNOSIS — I509 Heart failure, unspecified: Secondary | ICD-10-CM | POA: Diagnosis not present

## 2022-06-25 DIAGNOSIS — E1122 Type 2 diabetes mellitus with diabetic chronic kidney disease: Secondary | ICD-10-CM | POA: Diagnosis not present

## 2022-06-25 DIAGNOSIS — M8580 Other specified disorders of bone density and structure, unspecified site: Secondary | ICD-10-CM | POA: Diagnosis not present

## 2022-06-25 DIAGNOSIS — I447 Left bundle-branch block, unspecified: Secondary | ICD-10-CM | POA: Diagnosis not present

## 2022-06-25 DIAGNOSIS — N1831 Chronic kidney disease, stage 3a: Secondary | ICD-10-CM | POA: Diagnosis not present

## 2022-06-25 DIAGNOSIS — I700 Atherosclerosis of aorta: Secondary | ICD-10-CM | POA: Diagnosis not present

## 2022-06-25 DIAGNOSIS — H6122 Impacted cerumen, left ear: Secondary | ICD-10-CM | POA: Diagnosis not present

## 2022-07-02 ENCOUNTER — Telehealth: Payer: Self-pay | Admitting: *Deleted

## 2022-07-02 NOTE — Progress Notes (Signed)
  Care Coordination Note  07/02/2022 Name: Jean [name] MRN: [id number] DOB: Jan 30, 1941  Jean [name] is a 81 y.o. year old female who is a primary care patient of [name], MD and is actively engaged with the care management team. I reached out to Carlena Sax by phone today to assist with re-scheduling a follow up visit with the RN Case Manager  Follow up plan: Unsuccessful telephone outreach attempt made. A HIPAA compliant phone message was left for the patient providing contact information and requesting a return call.   [facility]  Care Coordination Care Guide  Direct Dial: [phone]

## 2022-07-07 ENCOUNTER — Telehealth: Payer: Self-pay | Admitting: Pharmacy Technician

## 2022-07-07 DIAGNOSIS — Z596 Low income: Secondary | ICD-10-CM

## 2022-07-07 NOTE — Progress Notes (Signed)
[facility])                                            [facility] Team    07/07/2022  Jean [name] [birth year] [id number]  Received both patient and provider portion(s) of patient assistance application(s) for [employer]. Faxed completed application and required documents into [employer].    Brayli Klingbeil P. Myrta Mercer, CPhT [facility] [employer]  [phone]

## 2022-07-10 NOTE — Progress Notes (Signed)
  Care Coordination Note  07/10/2022 Name: Jean [name] MRN: [id number] DOB: Apr 29, 1941  Jean [name] is a 81 y.o. year old female who is a primary care patient of [name], MD and is actively engaged with the care management team. I reached out to Carlena Sax by phone today to assist with re-scheduling a follow up visit with the RN Case Manager  Follow up plan: Unsuccessful telephone outreach attempt made. A HIPAA compliant phone message was left for the patient providing contact information and requesting a return call.  We have been unable to make contact with the patient for follow up. The care management team is available to follow up with the patient after provider conversation with the patient regarding recommendation for care management engagement and subsequent re-referral to the care management team.   [facility] Coordination Care Guide  Direct Dial: [phone]

## 2022-08-13 ENCOUNTER — Telehealth: Payer: Self-pay | Admitting: Pharmacy Technician

## 2022-08-13 DIAGNOSIS — Z596 Low income: Secondary | ICD-10-CM

## 2022-08-13 NOTE — Progress Notes (Signed)
[name])                                            [place] Team    08/13/2022  Jean [name] 06-04-1941 [id number]  Care coordination call placed to [employer] in regard to [place] application.  Spoke to [place] who informs patient APPROVED  from 07/29/2022- 2/31/24. Patient will need to phone [employer] for refills when she needs the medication for delivery to her home.  [name] P. Jolynne Spurgin, [place]  [phone]

## 2022-08-17 ENCOUNTER — Other Ambulatory Visit: Payer: Self-pay | Admitting: Internal Medicine

## 2022-09-24 ENCOUNTER — Other Ambulatory Visit: Payer: Self-pay | Admitting: Internal Medicine

## 2022-10-26 ENCOUNTER — Other Ambulatory Visit: Payer: Self-pay | Admitting: Internal Medicine

## 2022-11-06 ENCOUNTER — Other Ambulatory Visit: Payer: Self-pay | Admitting: Internal Medicine

## 2022-11-06 DIAGNOSIS — E1122 Type 2 diabetes mellitus with diabetic chronic kidney disease: Secondary | ICD-10-CM

## 2022-12-27 ENCOUNTER — Telehealth: Payer: Self-pay | Admitting: Internal Medicine

## 2022-12-27 NOTE — Telephone Encounter (Signed)
I spoke with patient to r/s her AWV appt with Herbie Saxon.  Patient stated she no longer goes to [facility], she goes to [facility] now.  I canceled call her appointments at [facility]

## 2023-01-15 ENCOUNTER — Ambulatory Visit: Payer: Medicare Other | Admitting: Internal Medicine

## 2023-02-10 ENCOUNTER — Encounter: Payer: Medicare Other | Admitting: Internal Medicine

## 2023-05-04 ENCOUNTER — Other Ambulatory Visit: Payer: Self-pay | Admitting: Internal Medicine

## 2023-07-13 ENCOUNTER — Other Ambulatory Visit: Payer: Self-pay | Admitting: Internal Medicine

## 2023-07-22 ENCOUNTER — Other Ambulatory Visit: Payer: Self-pay | Admitting: Internal Medicine

## 2023-07-22 DIAGNOSIS — N182 Chronic kidney disease, stage 2 (mild): Secondary | ICD-10-CM

## 2024-03-29 ENCOUNTER — Other Ambulatory Visit: Payer: Self-pay | Admitting: Internal Medicine

## 2024-03-29 DIAGNOSIS — N182 Chronic kidney disease, stage 2 (mild): Secondary | ICD-10-CM
